# Patient Record
Sex: Female | Born: 1990 | Race: Black or African American | Hispanic: No | Marital: Single | Smoking: Never smoker
Health system: Southern US, Community
[De-identification: ages and names within clinical notes are randomized; demographics above are authoritative.]

## PROBLEM LIST (undated history)

## (undated) DIAGNOSIS — T7840XA Allergy, unspecified, initial encounter: Secondary | ICD-10-CM

## (undated) DIAGNOSIS — L509 Urticaria, unspecified: Secondary | ICD-10-CM

## (undated) DIAGNOSIS — T4145XA Adverse effect of unspecified anesthetic, initial encounter: Secondary | ICD-10-CM

## (undated) DIAGNOSIS — M1711 Unilateral primary osteoarthritis, right knee: Secondary | ICD-10-CM

## (undated) DIAGNOSIS — M2241 Chondromalacia patellae, right knee: Secondary | ICD-10-CM

## (undated) DIAGNOSIS — R7989 Other specified abnormal findings of blood chemistry: Secondary | ICD-10-CM

## (undated) DIAGNOSIS — R011 Cardiac murmur, unspecified: Secondary | ICD-10-CM

## (undated) DIAGNOSIS — K589 Irritable bowel syndrome without diarrhea: Secondary | ICD-10-CM

## (undated) DIAGNOSIS — D649 Anemia, unspecified: Secondary | ICD-10-CM

## (undated) DIAGNOSIS — K581 Irritable bowel syndrome with constipation: Secondary | ICD-10-CM

## (undated) DIAGNOSIS — T8859XA Other complications of anesthesia, initial encounter: Secondary | ICD-10-CM

## (undated) DIAGNOSIS — L83 Acanthosis nigricans: Secondary | ICD-10-CM

## (undated) DIAGNOSIS — M6751 Plica syndrome, right knee: Secondary | ICD-10-CM

## (undated) HISTORY — DX: Acanthosis nigricans: L83

## (undated) HISTORY — DX: Irritable bowel syndrome with constipation: K58.1

## (undated) HISTORY — DX: Allergy, unspecified, initial encounter: T78.40XA

## (undated) HISTORY — DX: Urticaria, unspecified: L50.9

## (undated) HISTORY — DX: Other specified abnormal findings of blood chemistry: R79.89

## (undated) HISTORY — DX: Cardiac murmur, unspecified: R01.1

## (undated) HISTORY — DX: Irritable bowel syndrome without diarrhea: K58.9

---
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
## 2013-09-30 ENCOUNTER — Emergency Department (HOSPITAL_BASED_OUTPATIENT_CLINIC_OR_DEPARTMENT_OTHER)
Admission: EM | Admit: 2013-09-30 | Discharge: 2013-09-30 | Disposition: A | Discharge status: Home / Self Care | Payer: Federal, State, Local not specified - PPO | Attending: Emergency Medicine | Admitting: Emergency Medicine

## 2013-09-30 ENCOUNTER — Encounter (HOSPITAL_BASED_OUTPATIENT_CLINIC_OR_DEPARTMENT_OTHER): Payer: Self-pay | Admitting: Emergency Medicine

## 2013-09-30 ENCOUNTER — Emergency Department (HOSPITAL_BASED_OUTPATIENT_CLINIC_OR_DEPARTMENT_OTHER): Payer: Federal, State, Local not specified - PPO

## 2013-09-30 DIAGNOSIS — H9209 Otalgia, unspecified ear: Secondary | ICD-10-CM | POA: Insufficient documentation

## 2013-09-30 DIAGNOSIS — Z79899 Other long term (current) drug therapy: Secondary | ICD-10-CM | POA: Insufficient documentation

## 2013-09-30 DIAGNOSIS — J208 Acute bronchitis due to other specified organisms: Secondary | ICD-10-CM

## 2013-09-30 DIAGNOSIS — J209 Acute bronchitis, unspecified: Secondary | ICD-10-CM | POA: Insufficient documentation

## 2013-09-30 MED ORDER — ALBUTEROL SULFATE HFA 108 (90 BASE) MCG/ACT IN AERS
2.0000 | INHALATION_SPRAY | RESPIRATORY_TRACT | Status: DC | PRN
  Administered 2013-09-30: 2 via RESPIRATORY_TRACT
  Filled 2013-09-30: qty 6.7

## 2013-09-30 NOTE — ED Provider Notes (Signed)
CSN: [id number]     Arrival date & time 09/30/13  2147 History   First MD Initiated Contact with Patient 09/30/13 2233     This chart was scribed for Ethelda Chick, MD by Arlan Organ, ED Scribe. This patient was seen in room MH08/[facility]08 and the patient's care was started 10:44 PM.   Chief Complaint  Patient presents with  . URI   Patient is a 22 y.o. female presenting with URI. The history is provided by the patient. No language interpreter was used.  URI Presenting symptoms: congestion, cough, ear pain and sore throat   Presenting symptoms: no fever   Congestion:    Location:  Chest Cough:    Severity:  Mild   Onset quality:  Gradual   Duration:  3 days   Timing:  Intermittent   Progression:  Unchanged   Chronicity:  New Ear pain:    Severity:  Mild   Onset quality:  Gradual   Duration:  3 days   Progression:  Unchanged   Chronicity:  New Sore throat:    Severity:  Mild   Onset quality:  Gradual   Duration:  4 days   Timing:  Constant   Progression:  Unchanged Severity:  Mild Onset quality:  Gradual Duration:  4 days Timing:  Intermittent   HPI Comments: Courtney [name] is a 22 y.o. female who presents to the Emergency Department complaining of a gradual onset, unchanged sore throat that initially started 4 day ago. She lists otalgia, SOB, and nasal congestion as associated symptoms. Pt states she noted the onset of her symptoms while she was at work on Friday, and states her symptoms started with her sore throat. She denies fever, lightheadedness, or generalized body aches. She denies any recent travel. No sick contacts.  History reviewed. No pertinent past medical history. History reviewed. No pertinent past surgical history. No family history on file. History  Substance Use Topics  . Smoking status: Never Smoker   . Smokeless tobacco: Never Used  . Alcohol Use: No   OB History   Grav Para Term Preterm Abortions TAB SAB Ect Mult Living                 Review of  Systems  Constitutional: Negative for fever.  HENT: Positive for congestion, ear pain and sore throat.   Respiratory: Positive for cough and shortness of breath.   Neurological: Negative for light-headedness.    Allergies  Review of patient's allergies indicates no known allergies.  Home Medications   Current Outpatient Rx  Name  Route  Sig  Dispense  Refill  . ibuprofen (ADVIL,MOTRIN) 200 MG tablet   Oral   Take 600 mg by mouth once.           . Norgestimate-Ethinyl Estradiol Triphasic (ORTHO TRI-CYCLEN LO) 0.18/0.215/0.25 MG-25 MCG tablet   Oral   Take 1 tablet by mouth daily.            Triage Vitals: BP 125/89  Pulse 80  Temp(Src) 98 F (36.7 C) (Oral)  Resp 16  SpO2 100%  Physical Exam  Nursing note and vitals reviewed. Constitutional: She is oriented to person, place, and time. She appears well-developed and well-nourished.  HENT:  Head: Normocephalic.  Mouth/Throat: Oropharynx is clear and moist.  Eyes: EOM are normal.  Neck: Normal range of motion.  Pulmonary/Chest: Effort normal. She has no wheezes.  Lungs clear No wheezing or crackles  Abdominal: She exhibits no distension.  Musculoskeletal: Normal  range of motion.  No swelling of lower extremities  Lymphadenopathy:    She has no cervical adenopathy.  Neurological: She is alert and oriented to person, place, and time.  Psychiatric: She has a normal mood and affect.  Note- no sig LAD  ED Course  Procedures (including critical care time)  DIAGNOSTIC STUDIES: Oxygen Saturation is 100% on RA, Normal by my interpretation.    COORDINATION OF CARE: 10:45 PM- Will give inhaler. Discussed treatment plan with pt at bedside and pt agreed to plan.     Labs Review Labs Reviewed - No data to display Imaging Review Dg Chest 2 View  09/30/2013   CLINICAL DATA:  Sore throat for 4 days. Shortness of breath, otalgia, nasal congestion.  EXAM: CHEST  2 VIEW  COMPARISON:  None.  FINDINGS: Mild hyperinflation.  The heart size and mediastinal contours are within normal limits. Both lungs are clear. The visualized skeletal structures are unremarkable.  IMPRESSION: No active cardiopulmonary disease.   Electronically Signed   By: Burman Nieves M.D.   On: 09/30/2013 23:38    EKG Interpretation   None       MDM   1. Viral bronchitis    Pt presenting with c/o cold symptoms with sore throat, nasal congestion, shortness of breath.  CXR reassuring.  Given inhaler for likely bronchitis.  She is overall nontoxic and well hydrated.  Discharged with strict return precautions.  Pt agreeable with plan.  I personally performed the services described in this documentation, which was scribed in my presence. The recorded information has been reviewed and is accurate.   Ethelda Chick, MD 10/02/13 1106

## 2013-09-30 NOTE — ED Notes (Signed)
C/o cold symptoms x 4 days with sore throat, congestion, sob, and congestion.

## 2013-09-30 NOTE — ED Notes (Signed)
Pt c/o cough congestion, minor  sore throat, and sob x 4 days [facility] and was referred her  Denies fever

## 2013-09-30 NOTE — ED Notes (Signed)
Patient transported to X-ray 

## 2014-01-12 ENCOUNTER — Encounter (HOSPITAL_BASED_OUTPATIENT_CLINIC_OR_DEPARTMENT_OTHER): Payer: Self-pay | Admitting: Emergency Medicine

## 2014-01-12 DIAGNOSIS — Z79899 Other long term (current) drug therapy: Secondary | ICD-10-CM | POA: Diagnosis not present

## 2014-01-12 DIAGNOSIS — J029 Acute pharyngitis, unspecified: Secondary | ICD-10-CM | POA: Insufficient documentation

## 2014-01-12 DIAGNOSIS — J3489 Other specified disorders of nose and nasal sinuses: Secondary | ICD-10-CM | POA: Insufficient documentation

## 2014-01-12 DIAGNOSIS — R05 Cough: Secondary | ICD-10-CM | POA: Diagnosis not present

## 2014-01-12 DIAGNOSIS — R059 Cough, unspecified: Secondary | ICD-10-CM | POA: Insufficient documentation

## 2014-01-12 LAB — RAPID STREP SCREEN (MED CTR MEBANE ONLY): Streptococcus, Group A Screen (Direct): NEGATIVE

## 2014-01-12 NOTE — ED Notes (Signed)
Sore throat x 4 days 

## 2014-01-13 ENCOUNTER — Emergency Department (HOSPITAL_BASED_OUTPATIENT_CLINIC_OR_DEPARTMENT_OTHER)
Admission: EM | Admit: 2014-01-13 | Discharge: 2014-01-13 | Disposition: A | Discharge status: Home / Self Care | Payer: Federal, State, Local not specified - PPO | Attending: Emergency Medicine | Admitting: Emergency Medicine

## 2014-01-13 DIAGNOSIS — J029 Acute pharyngitis, unspecified: Secondary | ICD-10-CM

## 2014-01-13 MED ORDER — PENICILLIN G BENZATHINE 1200000 UNIT/2ML IM SUSP
1.2000 10*6.[IU] | Freq: Once | INTRAMUSCULAR | Status: AC
  Administered 2014-01-13: 1.2 10*6.[IU] via INTRAMUSCULAR
  Filled 2014-01-13: qty 2

## 2014-01-13 NOTE — ED Provider Notes (Signed)
CSN: [id number]     Arrival date & time 01/12/14  2145 History   First MD Initiated Contact with Patient 01/13/14 0023     Chief Complaint  Patient presents with  . Sore Throat     (Consider location/radiation/quality/duration/timing/severity/associated sxs/prior Treatment) HPI Comments: Patient presents emergency department with chief complaint of sore throat. She states the sore throat started approximately 4 days ago. She denies any fevers chills. She endorses associated cough, and runny nose. She has not tried taking anything to alleviate her symptoms. There are no aggravating or alleviating factors.  The history is provided by the patient. No language interpreter was used.    History reviewed. No pertinent past medical history. History reviewed. No pertinent past surgical history. No family history on file. History  Substance Use Topics  . Smoking status: Never Smoker   . Smokeless tobacco: Never Used  . Alcohol Use: No   OB History   Grav Para Term Preterm Abortions TAB SAB Ect Mult Living                 Review of Systems  Constitutional: Negative for fever and chills.  HENT: Positive for postnasal drip, rhinorrhea, sinus pressure, sneezing and sore throat.   Respiratory: Positive for cough. Negative for shortness of breath.   Cardiovascular: Negative for chest pain.  Gastrointestinal: Negative for nausea, vomiting, abdominal pain, diarrhea and constipation.  Genitourinary: Negative for dysuria.      Allergies  Review of patient's allergies indicates no known allergies.  Home Medications   Current Outpatient Rx  Name  Route  Sig  Dispense  Refill  . ibuprofen (ADVIL,MOTRIN) 200 MG tablet   Oral   Take 600 mg by mouth once.           . Norgestimate-Ethinyl Estradiol Triphasic (ORTHO TRI-CYCLEN LO) 0.18/0.215/0.25 MG-25 MCG tablet   Oral   Take 1 tablet by mouth daily.            BP 126/70  Pulse 82  Temp(Src) 98.3 F (36.8 C) (Oral)  Resp 18  Ht  5\' 5"  (1.651 m)  Wt 150 lb (68.04 kg)  BMI 24.96 kg/m2  SpO2 100%  LMP 01/07/2014 Physical Exam  Nursing note and vitals reviewed. Constitutional: She is oriented to person, place, and time. She appears well-developed and well-nourished.  HENT:  Head: Normocephalic and atraumatic.  Oropharynx is moderately erythematous, no evidence of exudates or abscess, uvula is midline, airway is intact  Eyes: Conjunctivae and EOM are normal. Pupils are equal, round, and reactive to light.  Neck: Normal range of motion. Neck supple.  Cardiovascular: Normal rate and regular rhythm.  Exam reveals no gallop and no friction rub.   No murmur heard. Pulmonary/Chest: Effort normal and breath sounds normal. No respiratory distress. She has no wheezes. She has no rales. She exhibits no tenderness.  Abdominal: Soft. She exhibits no distension and no mass. There is no tenderness. There is no rebound and no guarding.  Musculoskeletal: Normal range of motion. She exhibits no edema and no tenderness.  Neurological: She is alert and oriented to person, place, and time.  Skin: Skin is warm and dry.  Psychiatric: She has a normal mood and affect. Her behavior is normal. Judgment and thought content normal.    ED Course  Procedures (including critical care time) Labs Review Labs Reviewed  RAPID STREP SCREEN  CULTURE, GROUP A STREP   Imaging Review No results found.   EKG Interpretation None  MDM   Final diagnoses:  Pharyngitis   Patients symptoms are consistent with URI, likely viral etiology. Discussed that antibiotics are not indicated for viral infections, but will give the patient a dose of penicillin per request. Pt will be discharged with symptomatic treatment.  Verbalizes understanding and is agreeable with plan. Pt is hemodynamically stable & in NAD prior to dc.     Roxy Horsemanobert Sheniqua Carolan, PA-C 01/13/14 [phone]

## 2014-01-13 NOTE — Discharge Instructions (Signed)
Pharyngitis °Pharyngitis is redness, pain, and swelling (inflammation) of your pharynx.  °CAUSES  °Pharyngitis is usually caused by infection. Most of the time, these infections are from viruses (viral) and are part of a cold. However, sometimes pharyngitis is caused by bacteria (bacterial). Pharyngitis can also be caused by allergies. Viral pharyngitis may be spread from person to person by coughing, sneezing, and personal items or utensils (cups, forks, spoons, toothbrushes). Bacterial pharyngitis may be spread from person to person by more intimate contact, such as kissing.  °SIGNS AND SYMPTOMS  °Symptoms of pharyngitis include:   °· Sore throat.   °· Tiredness (fatigue).   °· Low-grade fever.   °· Headache. °· Joint pain and muscle aches. °· Skin rashes. °· Swollen lymph nodes. °· Plaque-like film on throat or tonsils (often seen with bacterial pharyngitis). °DIAGNOSIS  °Your health care provider will ask you questions about your illness and your symptoms. Your medical history, along with a physical exam, is often all that is needed to diagnose pharyngitis. Sometimes, a rapid strep test is done. Other lab tests may also be done, depending on the suspected cause.  °TREATMENT  °Viral pharyngitis will usually get better in 3 4 days without the use of medicine. Bacterial pharyngitis is treated with medicines that kill germs (antibiotics).  °HOME CARE INSTRUCTIONS  °· Drink enough water and fluids to keep your urine clear or pale yellow.   °· Only take over-the-counter or prescription medicines as directed by your health care provider:   °· If you are prescribed antibiotics, make sure you finish them even if you start to feel better.   °· Do not take aspirin.   °· Get lots of rest.   °· Gargle with 8 oz of salt water (½ tsp of salt per 1 qt of water) as often as every 1 2 hours to soothe your throat.   °· Throat lozenges (if you are not at risk for choking) or sprays may be used to soothe your throat. °SEEK MEDICAL  CARE IF:  °· You have large, tender lumps in your neck. °· You have a rash. °· You cough up green, yellow-brown, or bloody spit. °SEEK IMMEDIATE MEDICAL CARE IF:  °· Your neck becomes stiff. °· You drool or are unable to swallow liquids. °· You vomit or are unable to keep medicines or liquids down. °· You have severe pain that does not go away with the use of recommended medicines. °· You have trouble breathing (not caused by a stuffy nose). °MAKE SURE YOU:  °· Understand these instructions. °· Will watch your condition. °· Will get help right away if you are not doing well or get worse. °Document Released: 09/18/2005 Document Revised: 07/09/2013 Document Reviewed: 05/26/2013 °[employer]® Patient Information ©2014 [employer]. ° °

## 2014-01-13 NOTE — ED Provider Notes (Signed)
Medical screening examination/treatment/procedure(s) were performed by non-physician practitioner and as supervising physician I was immediately available for consultation/collaboration.   EKG Interpretation None        Hanley SeamenJohn L Zaidin Blyden, MD 01/13/14 [phone]

## 2014-01-13 NOTE — ED Notes (Signed)
No rxn to ABT noted

## 2014-01-14 LAB — CULTURE, GROUP A STREP

## 2014-04-22 ENCOUNTER — Encounter (HOSPITAL_COMMUNITY): Payer: Self-pay | Admitting: *Deleted

## 2014-04-27 ENCOUNTER — Encounter (HOSPITAL_COMMUNITY): Payer: Self-pay | Admitting: Pharmacist

## 2014-04-30 ENCOUNTER — Ambulatory Visit (HOSPITAL_COMMUNITY): Payer: Federal, State, Local not specified - PPO | Admitting: Anesthesiology

## 2014-04-30 ENCOUNTER — Encounter (HOSPITAL_COMMUNITY)
Admission: RE | Disposition: A | Discharge status: Home / Self Care | Payer: Self-pay | Source: Ambulatory Visit | Attending: Obstetrics and Gynecology

## 2014-04-30 ENCOUNTER — Encounter (HOSPITAL_COMMUNITY): Payer: Self-pay | Admitting: Anesthesiology

## 2014-04-30 ENCOUNTER — Ambulatory Visit (HOSPITAL_COMMUNITY)
Admission: RE | Admit: 2014-04-30 | Discharge: 2014-04-30 | Disposition: A | Discharge status: Home / Self Care | Payer: Federal, State, Local not specified - PPO | Source: Ambulatory Visit | Attending: Obstetrics and Gynecology | Admitting: Obstetrics and Gynecology

## 2014-04-30 ENCOUNTER — Encounter (HOSPITAL_COMMUNITY): Payer: Federal, State, Local not specified - PPO | Admitting: Anesthesiology

## 2014-04-30 DIAGNOSIS — N838 Other noninflammatory disorders of ovary, fallopian tube and broad ligament: Secondary | ICD-10-CM | POA: Insufficient documentation

## 2014-04-30 DIAGNOSIS — N946 Dysmenorrhea, unspecified: Secondary | ICD-10-CM | POA: Diagnosis present

## 2014-04-30 DIAGNOSIS — N854 Malposition of uterus: Secondary | ICD-10-CM | POA: Diagnosis not present

## 2014-04-30 DIAGNOSIS — Z9889 Other specified postprocedural states: Secondary | ICD-10-CM

## 2014-04-30 HISTORY — PX: LAPAROSCOPY: SHX197

## 2014-04-30 LAB — TYPE AND SCREEN
ABO/RH(D): A POS
ANTIBODY SCREEN: NEGATIVE

## 2014-04-30 LAB — CBC
HCT: 33.1 % — ABNORMAL LOW (ref 36.0–46.0)
HEMOGLOBIN: 11.5 g/dL — AB (ref 12.0–15.0)
MCH: 30.5 pg (ref 26.0–34.0)
MCHC: 34.7 g/dL (ref 30.0–36.0)
MCV: 87.8 fL (ref 78.0–100.0)
PLATELETS: 223 10*3/uL (ref 150–400)
RBC: 3.77 MIL/uL — AB (ref 3.87–5.11)
RDW: 12.6 % (ref 11.5–15.5)
WBC: 7.8 10*3/uL (ref 4.0–10.5)

## 2014-04-30 LAB — PREGNANCY, URINE: PREG TEST UR: NEGATIVE

## 2014-04-30 LAB — ABO/RH: ABO/RH(D): A POS

## 2014-04-30 SURGERY — LAPAROSCOPY, DIAGNOSTIC
Anesthesia: General

## 2014-04-30 MED ORDER — GLYCOPYRROLATE 0.2 MG/ML IJ SOLN
INTRAMUSCULAR | Status: DC | PRN
  Administered 2014-04-30: 0.6 mg via INTRAVENOUS

## 2014-04-30 MED ORDER — PROPOFOL 10 MG/ML IV BOLUS
INTRAVENOUS | Status: DC | PRN
  Administered 2014-04-30: 30 mg via INTRAVENOUS
  Administered 2014-04-30: 150 mg via INTRAVENOUS

## 2014-04-30 MED ORDER — FENTANYL CITRATE 0.05 MG/ML IJ SOLN: 25.0000 ug | INTRAMUSCULAR | Status: DC | PRN

## 2014-04-30 MED ORDER — LIDOCAINE HCL (CARDIAC) 20 MG/ML IV SOLN
INTRAVENOUS | Status: DC | PRN
  Administered 2014-04-30: 30 mg via INTRAVENOUS

## 2014-04-30 MED ORDER — FENTANYL CITRATE 0.05 MG/ML IJ SOLN
INTRAMUSCULAR | Status: DC | PRN
  Administered 2014-04-30: 100 ug via INTRAVENOUS
  Administered 2014-04-30 (×3): 50 ug via INTRAVENOUS

## 2014-04-30 MED ORDER — ROCURONIUM BROMIDE 100 MG/10ML IV SOLN
INTRAVENOUS | Status: DC | PRN
  Administered 2014-04-30: 10 mg via INTRAVENOUS
  Administered 2014-04-30: 30 mg via INTRAVENOUS

## 2014-04-30 MED ORDER — SCOPOLAMINE 1 MG/3DAYS TD PT72
MEDICATED_PATCH | TRANSDERMAL | Status: AC
  Filled 2014-04-30: qty 1

## 2014-04-30 MED ORDER — MIDAZOLAM HCL 2 MG/2ML IJ SOLN: 0.5000 mg | Freq: Once | INTRAMUSCULAR | Status: DC | PRN

## 2014-04-30 MED ORDER — PROMETHAZINE HCL 25 MG/ML IJ SOLN: 6.2500 mg | INTRAMUSCULAR | Status: DC | PRN

## 2014-04-30 MED ORDER — BUPIVACAINE HCL (PF) 0.25 % IJ SOLN
INTRAMUSCULAR | Status: AC
  Filled 2014-04-30: qty 30

## 2014-04-30 MED ORDER — ONDANSETRON HCL 4 MG/2ML IJ SOLN
INTRAMUSCULAR | Status: DC | PRN
  Administered 2014-04-30: 4 mg via INTRAVENOUS

## 2014-04-30 MED ORDER — OXYCODONE-ACETAMINOPHEN 5-325 MG PO TABS: 1.0000 | ORAL_TABLET | ORAL | Status: DC | PRN

## 2014-04-30 MED ORDER — CEFAZOLIN SODIUM-DEXTROSE 2-3 GM-% IV SOLR
INTRAVENOUS | Status: AC
  Filled 2014-04-30: qty 50

## 2014-04-30 MED ORDER — SCOPOLAMINE 1 MG/3DAYS TD PT72
1.0000 | MEDICATED_PATCH | Freq: Once | TRANSDERMAL | Status: DC
  Administered 2014-04-30: 1.5 mg via TRANSDERMAL

## 2014-04-30 MED ORDER — NEOSTIGMINE METHYLSULFATE 10 MG/10ML IV SOLN
INTRAVENOUS | Status: DC | PRN
  Administered 2014-04-30: 3 mg via INTRAVENOUS

## 2014-04-30 MED ORDER — LACTATED RINGERS IV SOLN
INTRAVENOUS | Status: DC
  Administered 2014-04-30 (×2): via INTRAVENOUS

## 2014-04-30 MED ORDER — LIDOCAINE HCL (CARDIAC) 20 MG/ML IV SOLN
INTRAVENOUS | Status: AC
  Filled 2014-04-30: qty 5

## 2014-04-30 MED ORDER — DEXAMETHASONE SODIUM PHOSPHATE 10 MG/ML IJ SOLN
INTRAMUSCULAR | Status: DC | PRN
  Administered 2014-04-30: 8 mg via INTRAVENOUS

## 2014-04-30 MED ORDER — KETOROLAC TROMETHAMINE 30 MG/ML IJ SOLN: 15.0000 mg | Freq: Once | INTRAMUSCULAR | Status: DC | PRN

## 2014-04-30 MED ORDER — DEXAMETHASONE SODIUM PHOSPHATE 10 MG/ML IJ SOLN
INTRAMUSCULAR | Status: AC
  Filled 2014-04-30: qty 1

## 2014-04-30 MED ORDER — MIDAZOLAM HCL 2 MG/2ML IJ SOLN
INTRAMUSCULAR | Status: DC | PRN
  Administered 2014-04-30: 2 mg via INTRAVENOUS

## 2014-04-30 MED ORDER — MEPERIDINE HCL 25 MG/ML IJ SOLN: 6.2500 mg | INTRAMUSCULAR | Status: DC | PRN

## 2014-04-30 MED ORDER — KETOROLAC TROMETHAMINE 30 MG/ML IJ SOLN
INTRAMUSCULAR | Status: DC | PRN
  Administered 2014-04-30: 30 mg via INTRAVENOUS

## 2014-04-30 MED ORDER — NEOSTIGMINE METHYLSULFATE 10 MG/10ML IV SOLN
INTRAVENOUS | Status: AC
  Filled 2014-04-30: qty 1

## 2014-04-30 MED ORDER — MIDAZOLAM HCL 2 MG/2ML IJ SOLN
INTRAMUSCULAR | Status: AC
  Filled 2014-04-30: qty 2

## 2014-04-30 MED ORDER — GLYCOPYRROLATE 0.2 MG/ML IJ SOLN
INTRAMUSCULAR | Status: AC
Start: 2014-04-30 — End: 2014-04-30
  Filled 2014-04-30: qty 1

## 2014-04-30 MED ORDER — GLYCOPYRROLATE 0.2 MG/ML IJ SOLN
INTRAMUSCULAR | Status: AC
  Filled 2014-04-30: qty 1

## 2014-04-30 MED ORDER — IBUPROFEN 600 MG PO TABS: 600.0000 mg | ORAL_TABLET | Freq: Four times a day (QID) | ORAL | Status: DC | PRN

## 2014-04-30 MED ORDER — ONDANSETRON HCL 4 MG/2ML IJ SOLN
INTRAMUSCULAR | Status: AC
  Filled 2014-04-30: qty 2

## 2014-04-30 MED ORDER — PROPOFOL 10 MG/ML IV EMUL
INTRAVENOUS | Status: AC
  Filled 2014-04-30: qty 20

## 2014-04-30 MED ORDER — KETOROLAC TROMETHAMINE 30 MG/ML IJ SOLN
INTRAMUSCULAR | Status: AC
  Filled 2014-04-30: qty 1

## 2014-04-30 MED ORDER — 0.9 % SODIUM CHLORIDE (POUR BTL) OPTIME
TOPICAL | Status: DC | PRN
  Administered 2014-04-30: 1000 mL

## 2014-04-30 MED ORDER — FENTANYL CITRATE 0.05 MG/ML IJ SOLN
INTRAMUSCULAR | Status: AC
  Filled 2014-04-30: qty 5

## 2014-04-30 MED ORDER — ROCURONIUM BROMIDE 100 MG/10ML IV SOLN
INTRAVENOUS | Status: AC
  Filled 2014-04-30: qty 1

## 2014-04-30 MED ORDER — CEFAZOLIN SODIUM-DEXTROSE 2-3 GM-% IV SOLR
2.0000 g | INTRAVENOUS | Status: AC
  Administered 2014-04-30: 2 g via INTRAVENOUS

## 2014-04-30 MED ORDER — BUPIVACAINE HCL (PF) 0.25 % IJ SOLN
INTRAMUSCULAR | Status: DC | PRN
  Administered 2014-04-30: 17 mL

## 2014-04-30 SURGICAL SUPPLY — 33 items
ADH SKN CLS APL DERMABOND .7 (GAUZE/BANDAGES/DRESSINGS) ×1
APL SKNCLS STERI-STRIP NONHPOA (GAUZE/BANDAGES/DRESSINGS)
BAG SPEC RTRVL LRG 6X4 10 (ENDOMECHANICALS)
BENZOIN TINCTURE PRP APPL 2/3 (GAUZE/BANDAGES/DRESSINGS) IMPLANT
BLADE SURG 11 STRL SS (BLADE) ×2 IMPLANT
CHLORAPREP W/TINT 26ML (MISCELLANEOUS) ×2 IMPLANT
CLOTH BEACON ORANGE TIMEOUT ST (SAFETY) ×2 IMPLANT
DERMABOND ADVANCED (GAUZE/BANDAGES/DRESSINGS) ×1
DERMABOND ADVANCED .7 DNX12 (GAUZE/BANDAGES/DRESSINGS) ×1 IMPLANT
DRSG COVADERM PLUS 2X2 (GAUZE/BANDAGES/DRESSINGS) ×3 IMPLANT
DRSG OPSITE POSTOP 3X4 (GAUZE/BANDAGES/DRESSINGS) ×1 IMPLANT
FORCEPS CUTTING 33CM 5MM (CUTTING FORCEPS) IMPLANT
GLOVE BIO SURGEON STRL SZ7 (GLOVE) ×2 IMPLANT
GLOVE BIOGEL PI IND STRL 7.0 (GLOVE) ×2 IMPLANT
GLOVE BIOGEL PI INDICATOR 7.0 (GLOVE) ×2
GOWN STRL REUS W/TWL LRG LVL3 (GOWN DISPOSABLE) ×4 IMPLANT
PACK LAPAROSCOPY BASIN (CUSTOM PROCEDURE TRAY) ×2 IMPLANT
POUCH SPECIMEN RETRIEVAL 10MM (ENDOMECHANICALS) IMPLANT
PROTECTOR NERVE ULNAR (MISCELLANEOUS) ×2 IMPLANT
SET IRRIG TUBING LAPAROSCOPIC (IRRIGATION / IRRIGATOR) IMPLANT
STRIP CLOSURE SKIN 1/4X4 (GAUZE/BANDAGES/DRESSINGS) IMPLANT
SUT MNCRL AB 4-0 PS2 18 (SUTURE) ×2 IMPLANT
SUT PLAIN 2 0 (SUTURE)
SUT PLAIN ABS 2-0 CT1 27XMFL (SUTURE) IMPLANT
SUT VICRYL 0 UR6 27IN ABS (SUTURE) ×2 IMPLANT
SYR 30ML LL (SYRINGE) ×1 IMPLANT
TOWEL OR 17X24 6PK STRL BLUE (TOWEL DISPOSABLE) ×4 IMPLANT
TRAY FOLEY CATH 14FR (SET/KITS/TRAYS/PACK) ×2 IMPLANT
TROCAR XCEL NON-BLD 11X100MML (ENDOMECHANICALS) IMPLANT
TROCAR XCEL NON-BLD 5MMX100MML (ENDOMECHANICALS) ×2 IMPLANT
TROCAR XCEL OPT SLVE 5M 100M (ENDOMECHANICALS) IMPLANT
WARMER LAPAROSCOPE (MISCELLANEOUS) ×2 IMPLANT
WATER STERILE IRR 1000ML POUR (IV SOLUTION) ×2 IMPLANT

## 2014-04-30 NOTE — Anesthesia Preprocedure Evaluation (Signed)
Anesthesia Evaluation  Patient identified by MRN, date of birth, ID band Patient awake    Reviewed: Allergy & Precautions, H&P , Patient's Chart, lab work & pertinent test results, reviewed documented beta blocker date and time   History of Anesthesia Complications Negative for: history of anesthetic complications  Airway Mallampati: II TM Distance: >3 FB Neck ROM: full    Dental   Pulmonary  breath sounds clear to auscultation        Cardiovascular Exercise Tolerance: Good Rhythm:regular Rate:Normal     Neuro/Psych    GI/Hepatic   Endo/Other    Renal/GU      Musculoskeletal   Abdominal   Peds  Hematology   Anesthesia Other Findings   Reproductive/Obstetrics                           Anesthesia Physical Anesthesia Plan  ASA: I  Anesthesia Plan: General ETT   Post-op Pain Management:    Induction:   Airway Management Planned:   Additional Equipment:   Intra-op Plan:   Post-operative Plan:   Informed Consent: I have reviewed the patients History and Physical, chart, labs and discussed the procedure including the risks, benefits and alternatives for the proposed anesthesia with the patient or authorized representative who has indicated his/her understanding and acceptance.   Dental Advisory Given  Plan Discussed with: CRNA and Surgeon  Anesthesia Plan Comments:         Anesthesia Quick Evaluation  

## 2014-04-30 NOTE — H&P (Signed)
  History of Present Illness  General:  Severe dysmenorrhea since 7th grade. Has been on OCPs since that time. OCPs have recently not relieved symptoms. Pt has a strong family h/o endometriosis and desires definitive diagnosis. Mother is present. States that her daughter sometimes lays in the fetal position during menses, has nausea and vomiting. Uses muscle relaxants.   Current Medications  Taking   Tri-Sprintec 0.18/0.215/0.25 MG-35 MCG Tablet 1 tablet Once a day, Notes: Did not work   Discontinued   Voltaren 50 MG Tablet 1 tablet twice a day with food for menstrual cramping   Percocet 5-325 mg 5-325 mg Tablets one or two tablets every four to six hours prn pain   Medication List reviewed and reconciled with the patient    Past Medical History  Menstrual Management   Surgical History  None    Family History  Father: alive  Mother: alive  denies any GYN family cancer hx.   Social History  General:  Tobacco use  cigarettes: Never smoked Tobacco history last updated 04/29/2014 no Smoking.  no Tobacco Exposure.  no Recreational drug use.  Exercise: yes, regularly.  Marital Status: single.  Children: none.  Lives with mother, step father and 1/2 sister. Attends [employer].   Gyn History  Sexual activity never sexually active.  Periods : every month.  LMP 04/06/14.  Birth control ocp.  Last pap smear date 10/17/10, negative.  Denies H/O Last mammogram date.  Denies H/O Abnormal pap smear.  Denies H/O STD.    OB History  Never been pregnant per patient.    Allergies  N.K.D.A.   Hospitalization/Major Diagnostic Procedure  None    Review of Systems  Denies fever/chills, chest pain, SOB, headaches, numbness/tingling. No h/o complication with anesthesia, bleeding disorders or blood clots.   Vital Signs  Wt 154, Wt change -6 lb, Ht 65, BMI 25.62, Pulse sitting 91, BP sitting 111/73.   Physical Examination  GENERAL:  Patient appears alert and oriented.   General Appearance: well-appearing, well-developed, no acute distress.  Speech: clear.  LUNGS:  Auscultation: no wheezing/rhonchi/rales. CTA bilaterally.  HEART:  Heart sounds: normal. RRR. no murmur.  ABDOMEN:  General: soft nontender, nondistended, no masses.  FEMALE GENITOURINARY:  Pelvic Not examined.  EXTREMITIES:  General: No edema or calf tenderness.     Assessments   1. Pre-op exam - V72.84 (Primary)   2. Dysmenorrhea - 625.3   Treatment  1. Pre-op exam  Notes: R/B/A of procedure discussed with pt at length. All questions answered. Consent obtained. Discussed at lenghth with mother and pt goal of surgery, which is diagnostic. Will ablate endometriosis if present and safe. Limitations reviewed with pt and mother. Will abort procedure and refer to a specialist of Stage 3-4. Ig adhesions presents, risk of injury to bowel, bladder, tubes ,etc increased. Declines trial of Depo Lupron. Both verbalized understanding.    Follow Up  2 Weeks post op

## 2014-04-30 NOTE — Discharge Instructions (Addendum)
Diagnostic Laparoscopy °Laparoscopy is a surgical procedure. It is used to diagnose and treat diseases inside the belly (abdomen). It is usually a brief, common, and relatively simple procedure. The laparoscopeis a thin, lighted, pencil-sized instrument. It is like a telescope. It is inserted into your abdomen through a small cut (incision). Your caregiver can look at the organs inside your body through this instrument. He or she can see if there is anything abnormal. °Laparoscopy can be done either in a hospital or outpatient clinic. You may be given a mild sedative to help you relax before the procedure. Once in the operating room, you will be given a drug to make you sleep (general anesthesia). Laparoscopy usually lasts less than 1 hour. After the procedure, you will be monitored in a recovery area until you are stable and doing well. Once you are home, it will take 2 to 3 days to fully recover. °RISKS AND COMPLICATIONS  °Laparoscopy has relatively few risks. Your caregiver will discuss the risks with you before the procedure. °Some problems that can occur include: °· Infection. °· Bleeding. °· Damage to other organs. °· Anesthetic side effects. °PROCEDURE °Once you receive anesthesia, your surgeon inflates the abdomen with a harmless gas (carbon dioxide). This makes the organs easier to see. The laparoscope is inserted into the abdomen through a small incision. This allows your surgeon to see into the abdomen. Other small instruments are also inserted into the abdomen through other small openings. Many surgeons attach a video camera to the laparoscope to enlarge the view. °During a diagnostic laparoscopy, the surgeon may be looking for inflammation, infection, or cancer. Your surgeon may take tissue samples(biopsies). The samples are sent to a specialist in looking at cells and tissue samples (pathologist). The pathologist examines them under a microscope. Biopsies can help to diagnose or confirm a  disease. °AFTER THE PROCEDURE  °· The gas is released from inside the abdomen. °· The incisions are closed with stitches (sutures). Because these incisions are small (usually less than 1/2 inch), there is usually minimal discomfort after the procedure. There may be some mild discomfort in the throat. This is from the tube placed in the throat while you were sleeping. You may have some mild abdominal discomfort. There may also be discomfort from the instrument placement incisions in the abdomen. °· The recovery time is shortened as long as there are no complications. °· You will rest in a recovery room until stable and doing well. As long as there are no complications, you may be allowed to go home. °FINDING OUT THE RESULTS OF YOUR TEST °Not all test results are available during your visit. If your test results are not back during the visit, make an appointment with your caregiver to find out the results. Do not assume everything is normal if you have not heard from your caregiver or the medical facility. It is important for you to follow up on all of your test results. °HOME CARE INSTRUCTIONS  °· Take all medicines as directed. °· Only take over-the-counter or prescription medicines for pain, discomfort, or fever as directed by your caregiver. °· Resume daily activities as directed. °· Showers are preferred over baths. °· You may resume sexual activities in 1 week or as directed. °· Do not drive while taking narcotics. °SEEK MEDICAL CARE IF:  °· There is increasing abdominal pain. °· There is new pain in the shoulders (shoulder strap areas). °· You feel lightheaded or faint. °· You have the chills. °· You or   your child has an oral temperature above 102° F (38.9° C). °· There is pus-like (purulent) drainage from any of the wounds. °· You are unable to pass gas or have a bowel movement. °· You feel sick to your stomach (nauseous) or throw up (vomit). °MAKE SURE YOU:  °· Understand these instructions. °· Will watch  your condition. °· Will get help right away if you are not doing well or get worse. °Document Released: 12/25/2000 Document Revised: 01/13/2013 Document Reviewed: 09/18/2007 °Exit[employer]® Patient Information ©2015 [employer]. This information is not intended to replace advice given to you by your health care provider. Make sure you discuss any questions you have with your health care provider. ° °Post Anesthesia Home Care Instructions ° °Activity: °Get plenty of rest for the remainder of the day. A responsible adult should stay with you for 24 hours following the procedure.  °For the next 24 hours, DO NOT: °-Drive a car °-Operate machinery °-Drink alcoholic beverages °-Take any medication unless instructed by your physician °-Make any legal decisions or sign important papers. ° °Meals: °Start with liquid foods such as gelatin or soup. Progress to regular foods as tolerated. Avoid greasy, spicy, heavy foods. If nausea and/or vomiting occur, drink only clear liquids until the nausea and/or vomiting subsides. Call your physician if vomiting continues. ° °Special Instructions/Symptoms: °Your throat may feel dry or sore from the anesthesia or the breathing tube placed in your throat during surgery. If this causes discomfort, gargle with warm salt water. The discomfort should disappear within 24 hours. ° °

## 2014-04-30 NOTE — Transfer of Care (Signed)
Immediate Anesthesia Transfer of Care Note  Patient: Courtney [name]  Procedure(s) Performed: Procedure(s): LAPAROSCOPY DIAGNOSTIC (N/A)  Patient Location: PACU  Anesthesia Type:General  Level of Consciousness: awake  Airway & Oxygen Therapy: Patient connected to nasal cannula oxygen  Post-op Assessment: Report given to PACU RN and Post -op Vital signs reviewed and stable  Post vital signs: Reviewed and stable  Complications: No apparent anesthesia complications

## 2014-04-30 NOTE — Anesthesia Postprocedure Evaluation (Signed)
  Anesthesia Post Note  Patient: Courtney [name]  Procedure(s) Performed: Procedure(s) (LRB): LAPAROSCOPY DIAGNOSTIC (N/A)  Anesthesia type: GA  Patient location: PACU  Post pain: Pain level controlled  Post assessment: Post-op Vital signs reviewed  Last Vitals:  Filed Vitals:   04/30/14 1800  BP: 113/50  Pulse: 53  Temp:   Resp: 12    Post vital signs: Reviewed  Level of consciousness: sedated  Complications: No apparent anesthesia complications

## 2014-04-30 NOTE — Interval H&P Note (Signed)
History and Physical Interval Note:  04/30/2014 3:58 PM  [name]  has presented today for surgery, with the diagnosis of Dysmenorrhea  The various methods of treatment have been discussed with the patient and family. After consideration of risks, benefits and other options for treatment, the patient has consented to  Procedure(s): LAPAROSCOPY DIAGNOSTIC (N/A) as a surgical intervention .  The patient's history has been reviewed, patient examined, no change in status, stable for surgery.  I have reviewed the patient's chart and labs.  Questions were answered to the patient's satisfaction.     Dion BodyVARNADO, Billye Pickerel

## 2014-04-30 NOTE — Brief Op Note (Signed)
04/30/2014  5:39 PM  PATIENT:  Courtney [name]  23 y.o. female  PRE-OPERATIVE DIAGNOSIS:  Dysmenorrhea  POST-OPERATIVE DIAGNOSIS:  Same  PROCEDURE:  Procedure(s): LAPAROSCOPY DIAGNOSTIC (N/A) Removal of left fallopian tube mass  SURGEON:  Surgeon(s) and Role:    * Geryl RankinsEvelyn Tobi Groesbeck, MD - Primary    * Sharon SellerJennifer M Ozan, DO - Assisting  PHYSICIAN ASSISTANT: Dr. Charlotta Newtonzan  ASSISTANTS: Technician   ANESTHESIA:   general  EBL:  Total I/O In: 400 [I.V.:400] Out: 100 [Urine:100]  BLOOD ADMINISTERED:none  DRAINS: Urinary Catheter (Foley)   LOCAL MEDICATIONS USED:  MARCAINE     SPECIMEN:  Source of Specimen:  fallopian tube mass, pelvic fluid  DISPOSITION OF SPECIMEN:  PATHOLOGY  COUNTS:  YES  TOURNIQUET:  * No tourniquets in log *  DICTATION: .Other Dictation: Dictation Number [id number]  PLAN OF CARE: Discharge to home after PACU  PATIENT DISPOSITION:  PACU - hemodynamically stable.   Delay start of Pharmacological VTE agent (>24hrs) due to surgical blood loss or risk of bleeding: yes

## 2014-05-01 ENCOUNTER — Encounter (HOSPITAL_COMMUNITY): Payer: Self-pay | Admitting: Obstetrics and Gynecology

## 2014-05-01 NOTE — Op Note (Signed)
NAME:  Courtney [name], Courtney [name]                    ACCOUNT NO.:  [id number]  MEDICAL RECORD NO.:  [id number]  LOCATION:  [facility]                          [facility]ACILITY:  [facility]  PHYSICIAN:  Pieter PartridgeEvelyn B Breahna Boylen, MD   DATE OF BIRTH:  [birth year]  DATE OF PROCEDURE: DATE OF DISCHARGE:                              OPERATIVE REPORT   PREOPERATIVE DIAGNOSIS:  Dysmenorrhea.  POSTOPERATIVE DIAGNOSIS:  Dysmenorrhea.  PROCEDURES: 1. Diagnostic laparoscopy. 2. Removal of left fallopian tube mass.  SURGEON:  Pieter PartridgeEvelyn B Macee Venables, MD  ASSISTANT:  Dr. Myna HidalgoJennifer Ozan and technician.  ANESTHESIA:  General.  ESTIMATED BLOOD LOSS:  Minimal.  URINE OUTPUT:  100 mL.  BLOOD ADMINISTERED:  None.  DRAINS:  Foley catheter.  ANTIBIOTICS:  Local 0.25% Marcaine.  SPECIMEN:  Fallopian tube mass, pelvic fluid sent to Pathology.  DISPOSITION:  To PACU, hemodynamically stable.  FINDINGS:  Normal uterus and ovaries bilaterally.  The left fallopian tube has a small clear vesicular lesion that was biopsied/removed. Fallopian tubes were clipped bilaterally.  Upper abdomen appeared normal.  Liver edge was normal.  Appendix normal.  PROCEDURE IN DETAIL:  Ms. Wallene DalesMott was taken to the operating room.  She underwent general endotracheal anesthesia without complications.  She was then placed in the dorsal supine position and prepped and draped in a normal sterile fashion.  A time-out was taken and then a Pederson speculum was inserted to the vagina after an exam under anesthesia, confirmed an anteverted uterus.  The cervix was identified and grasped with a single-tooth tenaculum.  I attempted to put a Hulka uterine manipulator through, but I could not get that through the endocervix. So, an Acorn uterine manipulator was then placed in and a single-tooth tenaculum stayed in place.  The speculum was then removed from the vagina.  Attention was turned to the abdomen.  A 5-mm incision was made in the infraumbilical fold.  Under direct  visualization, I went in with camera and a 5-mm trocar.  Once intraabdominal access was confirmed, I put on the low CO2 gas for insufflation of the abdomen.  I felt like I was in an adhesion, which actually turned out that it was through the portion of the omentum, but no bleeding was noted.  Once the abdomen was inflated, a second port in the left lower quadrant was inserted under direct visualization.  Both ports were placed, were premedicated with 0.25% Marcaine.  Once the second port was in, I was able to use a blunt probe manipulator and the blunt probe manipulator and the uterus and the pelvic anatomy were examined, and pictures were taken.  A syringe was used to get out of the pelvic fluid that was sent to Pathology.  Biopsy forceps were used to grasp the vesicular lesion on the fallopian tubes, and they were sharp and so they were not pulled, there was a little bit of tissues hanging.  I got the endocautery shears and cut that and cauterized the area where the biopsy was taken.  That area was then irrigated with a syringe.  All instruments were then removed from the abdomen under direct visualization.  While the umbilical trocar was then placed, expiratory  breaths were given.  The incisions were then reapproximated with 4-0 Monocryl in a subcuticular fashion.  Liquiderm was then applied to the incisions.  The single-tooth tenaculum and Acorn were removed from the uterus.  The cervix appeared to be hemostatic and was not bleeding.  No lacerations noted.  The patient tolerated the procedure well.  She received Ancef 2 g IV prior to the procedure.  All instrument, sponge, and needle counts were correct x3.  The patient tolerated the procedure well and went to the recovery room in stable condition.     Pieter Partridge, MD     EBV/MEDQ  D:  04/30/2014  T:  04/30/2014  Job:  [id number]

## 2015-02-25 ENCOUNTER — Encounter: Payer: Self-pay | Admitting: Dietician

## 2015-02-25 ENCOUNTER — Encounter: Payer: Federal, State, Local not specified - PPO | Attending: Obstetrics and Gynecology | Admitting: Dietician

## 2015-02-25 VITALS — Ht 65.5 in | Wt 178.9 lb

## 2015-02-25 DIAGNOSIS — Z713 Dietary counseling and surveillance: Secondary | ICD-10-CM | POA: Diagnosis not present

## 2015-02-25 DIAGNOSIS — Z6829 Body mass index (BMI) 29.0-29.9, adult: Secondary | ICD-10-CM | POA: Insufficient documentation

## 2015-02-25 DIAGNOSIS — E669 Obesity, unspecified: Secondary | ICD-10-CM | POA: Insufficient documentation

## 2015-02-25 NOTE — Progress Notes (Signed)
Medical Nutrition Therapy:  Appt start time: 1500 end time:  1600.   Assessment:  Primary concerns today: obesity. Patient lives with her mom and sister.  Her parents recently divorced this past year.  She is currently out of school and will be going back to [employer] starting in the fall.  She works as a Water engineer.  She played basketball for 12 years and has "bad knees" that effect her exercise level.  She eats out with her mom and sister 4-5 times a week to [employer], [employer], [employer], [address], or [address].  She loves to cook at home.  Her mom grocery shops occasionally but Courtney [name] will also grocery shop for things.  Her little sister likes to eat out a lot so sometimes she will pick her up something and then cook her own meal at home.  She has recently cut out her [facility] and increased her cardio workouts.  Weight history: she weighed 65 lbs at 24 years old, after stopping basketball gained to 160 lbs, then following laproscopic sugery in July 2015 and throughout the many court showings for her parents divorce she gained up to her highest weight of 184 lbs.  Today she weighs 179 lbs.  She has goals to lose weight and just be a healthier eater overall.  She wants for her clothes to fit well again.      Preferred Learning Style:  No preference indicated   Learning Readiness:  Ready  Change in progress   MEDICATIONS: see list   DIETARY INTAKE:  Usual eating pattern includes 2 meals and 3 snacks per day.  Everyday foods include fast food, fruit, string cheese.  Avoided foods include creamy foods like yogurt.    24-hr recall:  B ( AM): skips  Snk ( AM): banana  L (1-2 PM): [employer] 12 count nugget and lemonade or makes grilled chicken with olive oil and steamable veggies Snk ( PM): sometimes a cutie tangerine or string cheese D (5 PM): chicken wings, or [address] chicken with broccolli & cheese and mashed potatoes Snk (9 PM): blueberries,  kettle cooked chips, or string cheese Beverages: lemonade, occasionally a mini can of Coke, not much water - less than 24 oz per day, sometimes 1 cup of chocolate milk  Usual physical activity: runs on the treadmill for 1-2 miles at the gym in the evening and may do some strength training, 3-4 nights a week  Progress Towards Goal(s):  In progress.   Nutritional Diagnosis:  Damar-3.3 Overweight/obesity As related to frequent consumption of foods outside of the home and decreased physical activity over the last 3 years.  As evidenced by patient report and BMI >30.    Intervention:  Nutrition education and counseling.  Discussed healthy, sustainable weight loss goals.  Praised her for making her health a priority and for the changes she has made so far to her activity level and drink choices.  Discussed food timing and foods to eat around physical activity times.  Stated importance of food choices for weight loss goals.  Utilized MyPlate to demonstrate healthy, balanced meals.  Patient was engaged and had good nutrition questions, expect positive results.  Together we came up with the following plan:  Goals: Choose more grilled items and veggies and salads when out to restaurants.   Add in a morning gym routine 3-4 days a week. Add a protein to your morning fruit like cashews or peanuts.  Limit to 1/4 cup serving. Stretch after workouts  and have a low-fat protein like Malawiturkey or beef jerky to heal your muscles. During vacation focus on increasing water intake. Use the Plate Method to create healthy, balanced meals at home and when out to eat.  Teaching Method Utilized:  Visual Auditory  Handouts given during visit include:  MyPlate portion plate  Barriers to learning/adherence to lifestyle change: none  Demonstrated degree of understanding via:  Teach Back   Monitoring/Evaluation:  Dietary intake, exercise, and body weight in 2 month(s).

## 2015-02-25 NOTE — Patient Instructions (Addendum)
Choose more grilled items and veggies and salads when out to restaurants.   Add in a morning gym routine 3-4 days a week. Add a protein to your morning fruit like cashews or peanuts.  Limit to 1/4 cup serving. Stretch after workouts and have a low-fat protein like Malawiturkey or beef jerky to heal your muscles. During vacation focus on increasing water intake. Use the Plate Method to create healthy, balanced meals at home and when out to eat.

## 2015-04-29 ENCOUNTER — Ambulatory Visit: Payer: Federal, State, Local not specified - PPO | Admitting: Dietician

## 2015-07-03 DIAGNOSIS — M6751 Plica syndrome, right knee: Secondary | ICD-10-CM

## 2015-07-03 DIAGNOSIS — M1711 Unilateral primary osteoarthritis, right knee: Secondary | ICD-10-CM

## 2015-07-03 DIAGNOSIS — M2241 Chondromalacia patellae, right knee: Secondary | ICD-10-CM

## 2015-07-03 HISTORY — DX: Unilateral primary osteoarthritis, right knee: M17.11

## 2015-07-03 HISTORY — DX: Plica syndrome, right knee: M67.51

## 2015-07-03 HISTORY — DX: Chondromalacia patellae, right knee: M22.41

## 2015-07-26 ENCOUNTER — Other Ambulatory Visit: Payer: Self-pay | Admitting: Physician Assistant

## 2015-07-26 ENCOUNTER — Encounter (HOSPITAL_BASED_OUTPATIENT_CLINIC_OR_DEPARTMENT_OTHER): Payer: Self-pay | Admitting: *Deleted

## 2015-07-26 NOTE — H&P (Signed)
Courtney [name] comes in to discuss MRI and definitive treatment recommendation for her right knee.  History well outlined in the note by Dr. Farris HasKramer from earlier this month.  This is longstanding patellofemoral syndrome, right greater than left.  She has had a marked impact on all activity because of patellofemoral issues.  She can tolerate symptoms on the left, but they are completely intolerable on the right.  Her workup has included four view x-ray that shows a little lateral patellofemoral positioning.  MRI scan that shows overload lateral patellofemoral joint with a little partial thickness breakdown apex and lateral facet.  All other structures intact.  Her trochlear groove looks reasonable.  All of this reviewed with her.   History and general exam is reviewed.        EXAMINATION: Specifically, this is a healthy fit 24 year-old.  She has very god VMO strength on both sides.  She has lateral tracking.  Increased Q angle on both sides, at least moderate.  Lateral tracking and tethering, right much greater than left.  Tender medial plica, right much greater than left.  Some crepitus on the right, not too extreme.  Very mild patella alta.  All other ligaments stable.    DISPOSITION:  Long talk with Marsha.  More than 25 minutes spent face-to-face.  Need for treatment straightforward.  I have gone through the entire description of her pathology and treatment options.  Given her amount of tethering and the fact that there has been no previous intervention I am planning on exam under anesthesia, arthroscopy, chondroplasty patella and excise her plica.  I will then add a lateral retinacular release.  My hope is that this is going to be enough coupled with post-op rehab to allow improvement and a tolerable situation.  The next step would be a Fulkerson procedure, but I think based on what I am seeing I need to try a scope and lateral release before we move immediately to a Fulkerson procedure.  Went through all of that with  her in detail.  She understands if lateral release alone is not enough she may have to do a Fulkerson.  Paperwork complete.  All questions answered.  I will see her at the time of operative intervention.    Loreta Aveaniel F. Murphy, M.D.

## 2015-07-27 ENCOUNTER — Other Ambulatory Visit: Payer: Self-pay | Admitting: Physician Assistant

## 2015-07-29 ENCOUNTER — Ambulatory Visit (HOSPITAL_BASED_OUTPATIENT_CLINIC_OR_DEPARTMENT_OTHER): Payer: Federal, State, Local not specified - PPO | Admitting: Certified Registered"

## 2015-07-29 ENCOUNTER — Encounter (HOSPITAL_BASED_OUTPATIENT_CLINIC_OR_DEPARTMENT_OTHER): Payer: Self-pay | Admitting: Certified Registered"

## 2015-07-29 ENCOUNTER — Encounter (HOSPITAL_BASED_OUTPATIENT_CLINIC_OR_DEPARTMENT_OTHER)
Admission: RE | Disposition: A | Discharge status: Home / Self Care | Payer: Self-pay | Source: Ambulatory Visit | Attending: Orthopedic Surgery

## 2015-07-29 ENCOUNTER — Ambulatory Visit (HOSPITAL_BASED_OUTPATIENT_CLINIC_OR_DEPARTMENT_OTHER)
Admission: RE | Admit: 2015-07-29 | Discharge: 2015-07-29 | Disposition: A | Discharge status: Home / Self Care | Payer: Federal, State, Local not specified - PPO | Source: Ambulatory Visit | Attending: Orthopedic Surgery | Admitting: Orthopedic Surgery

## 2015-07-29 DIAGNOSIS — M6751 Plica syndrome, right knee: Secondary | ICD-10-CM | POA: Insufficient documentation

## 2015-07-29 DIAGNOSIS — M65861 Other synovitis and tenosynovitis, right lower leg: Secondary | ICD-10-CM | POA: Diagnosis not present

## 2015-07-29 DIAGNOSIS — M25861 Other specified joint disorders, right knee: Secondary | ICD-10-CM | POA: Diagnosis not present

## 2015-07-29 DIAGNOSIS — M2241 Chondromalacia patellae, right knee: Secondary | ICD-10-CM | POA: Diagnosis present

## 2015-07-29 HISTORY — DX: Anemia, unspecified: D64.9

## 2015-07-29 HISTORY — DX: Chondromalacia patellae, right knee: M22.41

## 2015-07-29 HISTORY — DX: Unilateral primary osteoarthritis, right knee: M17.11

## 2015-07-29 HISTORY — DX: Other complications of anesthesia, initial encounter: T88.59XA

## 2015-07-29 HISTORY — DX: Adverse effect of unspecified anesthetic, initial encounter: T41.45XA

## 2015-07-29 HISTORY — PX: KNEE ARTHROSCOPY WITH LATERAL RELEASE: SHX5649

## 2015-07-29 HISTORY — PX: SYNOVECTOMY: SHX5180

## 2015-07-29 HISTORY — DX: Plica syndrome, right knee: M67.51

## 2015-07-29 SURGERY — ARTHROSCOPY, KNEE, WITH LATERAL RETINACULUM RELEASE
Anesthesia: General | Site: Knee | Laterality: Right

## 2015-07-29 MED ORDER — ONDANSETRON HCL 4 MG/2ML IJ SOLN
INTRAMUSCULAR | Status: DC | PRN
  Administered 2015-07-29: 4 mg via INTRAVENOUS

## 2015-07-29 MED ORDER — BUPIVACAINE HCL (PF) 0.25 % IJ SOLN
INTRAMUSCULAR | Status: DC | PRN
  Administered 2015-07-29: 20 mL

## 2015-07-29 MED ORDER — METHYLPREDNISOLONE ACETATE 40 MG/ML IJ SUSP
INTRAMUSCULAR | Status: AC
  Filled 2015-07-29: qty 1

## 2015-07-29 MED ORDER — GLYCOPYRROLATE 0.2 MG/ML IJ SOLN: 0.2000 mg | Freq: Once | INTRAMUSCULAR | Status: DC | PRN

## 2015-07-29 MED ORDER — DEXAMETHASONE SODIUM PHOSPHATE 4 MG/ML IJ SOLN
INTRAMUSCULAR | Status: DC | PRN
  Administered 2015-07-29: 10 mg via INTRAVENOUS

## 2015-07-29 MED ORDER — HYDROMORPHONE HCL 1 MG/ML IJ SOLN
0.2500 mg | INTRAMUSCULAR | Status: DC | PRN
Start: 2015-07-29 — End: 2015-07-29
  Administered 2015-07-29 (×2): 0.5 mg via INTRAVENOUS

## 2015-07-29 MED ORDER — ONDANSETRON HCL 4 MG PO TABS: 4.0000 mg | ORAL_TABLET | Freq: Three times a day (TID) | ORAL | Status: DC | PRN

## 2015-07-29 MED ORDER — FENTANYL CITRATE (PF) 100 MCG/2ML IJ SOLN
INTRAMUSCULAR | Status: AC
  Filled 2015-07-29: qty 4

## 2015-07-29 MED ORDER — CEFAZOLIN SODIUM-DEXTROSE 2-3 GM-% IV SOLR
INTRAVENOUS | Status: AC
  Filled 2015-07-29: qty 50

## 2015-07-29 MED ORDER — CHLORHEXIDINE GLUCONATE 4 % EX LIQD: 60.0000 mL | Freq: Once | CUTANEOUS | Status: DC

## 2015-07-29 MED ORDER — OXYCODONE HCL 5 MG PO TABS
5.0000 mg | ORAL_TABLET | Freq: Once | ORAL | Status: AC | PRN
  Administered 2015-07-29: 5 mg via ORAL

## 2015-07-29 MED ORDER — MIDAZOLAM HCL 2 MG/2ML IJ SOLN
1.0000 mg | INTRAMUSCULAR | Status: DC | PRN
  Administered 2015-07-29: 2 mg via INTRAVENOUS

## 2015-07-29 MED ORDER — OXYCODONE-ACETAMINOPHEN 5-325 MG PO TABS: 1.0000 | ORAL_TABLET | ORAL | Status: DC | PRN

## 2015-07-29 MED ORDER — DEXAMETHASONE SODIUM PHOSPHATE 10 MG/ML IJ SOLN
INTRAMUSCULAR | Status: AC
  Filled 2015-07-29: qty 1

## 2015-07-29 MED ORDER — OXYCODONE HCL 5 MG/5ML PO SOLN: 5.0000 mg | Freq: Once | ORAL | Status: AC | PRN

## 2015-07-29 MED ORDER — PROPOFOL 10 MG/ML IV BOLUS
INTRAVENOUS | Status: DC | PRN
  Administered 2015-07-29: 200 mg via INTRAVENOUS

## 2015-07-29 MED ORDER — FENTANYL CITRATE (PF) 100 MCG/2ML IJ SOLN
50.0000 ug | INTRAMUSCULAR | Status: DC | PRN
  Administered 2015-07-29: 100 ug via INTRAVENOUS

## 2015-07-29 MED ORDER — LACTATED RINGERS IV SOLN: INTRAVENOUS | Status: DC

## 2015-07-29 MED ORDER — LIDOCAINE HCL (CARDIAC) 20 MG/ML IV SOLN
INTRAVENOUS | Status: DC | PRN
  Administered 2015-07-29: 80 mg via INTRAVENOUS

## 2015-07-29 MED ORDER — ONDANSETRON HCL 4 MG/2ML IJ SOLN
INTRAMUSCULAR | Status: AC
  Filled 2015-07-29: qty 2

## 2015-07-29 MED ORDER — MEPERIDINE HCL 25 MG/ML IJ SOLN: 6.2500 mg | INTRAMUSCULAR | Status: DC | PRN

## 2015-07-29 MED ORDER — OXYCODONE HCL 5 MG PO TABS
ORAL_TABLET | ORAL | Status: AC
  Filled 2015-07-29: qty 1

## 2015-07-29 MED ORDER — METHYLPREDNISOLONE ACETATE 80 MG/ML IJ SUSP
INTRAMUSCULAR | Status: DC | PRN
  Administered 2015-07-29: 80 mg

## 2015-07-29 MED ORDER — SCOPOLAMINE 1 MG/3DAYS TD PT72: 1.0000 | MEDICATED_PATCH | Freq: Once | TRANSDERMAL | Status: DC | PRN

## 2015-07-29 MED ORDER — SODIUM CHLORIDE 0.9 % IR SOLN
Status: DC | PRN
  Administered 2015-07-29: 3000 mL

## 2015-07-29 MED ORDER — LIDOCAINE HCL (CARDIAC) 20 MG/ML IV SOLN
INTRAVENOUS | Status: AC
  Filled 2015-07-29: qty 5

## 2015-07-29 MED ORDER — CEFAZOLIN SODIUM-DEXTROSE 2-3 GM-% IV SOLR
2.0000 g | INTRAVENOUS | Status: AC
  Administered 2015-07-29: 2 g via INTRAVENOUS

## 2015-07-29 MED ORDER — HYDROMORPHONE HCL 1 MG/ML IJ SOLN
INTRAMUSCULAR | Status: AC
  Filled 2015-07-29: qty 1

## 2015-07-29 MED ORDER — LACTATED RINGERS IV SOLN
INTRAVENOUS | Status: DC
  Administered 2015-07-29: 10 mL/h via INTRAVENOUS

## 2015-07-29 MED ORDER — MIDAZOLAM HCL 2 MG/2ML IJ SOLN
INTRAMUSCULAR | Status: AC
  Filled 2015-07-29: qty 4

## 2015-07-29 SURGICAL SUPPLY — 38 items
BANDAGE ELASTIC 6 VELCRO ST LF (GAUZE/BANDAGES/DRESSINGS) ×2 IMPLANT
BLADE CUDA 5.5 (BLADE) IMPLANT
BLADE CUDA GRT WHITE 3.5 (BLADE) IMPLANT
BLADE CUTTER GATOR 3.5 (BLADE) ×2 IMPLANT
BLADE CUTTER MENIS 5.5 (BLADE) IMPLANT
BLADE GREAT WHITE 4.2 (BLADE) ×2 IMPLANT
BUR OVAL 4.0 (BURR) IMPLANT
CUTTER MENISCUS  4.2MM (BLADE)
CUTTER MENISCUS 4.2MM (BLADE) IMPLANT
DRAPE ARTHROSCOPY W/POUCH 90 (DRAPES) ×2 IMPLANT
DURAPREP 26ML APPLICATOR (WOUND CARE) ×2 IMPLANT
ELECT MENISCUS 165MM 90D (ELECTRODE) ×1 IMPLANT
ELECT REM PT RETURN 9FT ADLT (ELECTROSURGICAL) ×2
ELECTRODE REM PT RTRN 9FT ADLT (ELECTROSURGICAL) IMPLANT
GAUZE SPONGE 4X4 12PLY STRL (GAUZE/BANDAGES/DRESSINGS) ×4 IMPLANT
GAUZE XEROFORM 1X8 LF (GAUZE/BANDAGES/DRESSINGS) ×2 IMPLANT
GLOVE BIO SURGEON STRL SZ 6.5 (GLOVE) ×1 IMPLANT
GLOVE BIOGEL PI IND STRL 7.0 (GLOVE) ×1 IMPLANT
GLOVE BIOGEL PI INDICATOR 7.0 (GLOVE) ×3
GLOVE ECLIPSE 7.0 STRL STRAW (GLOVE) ×2 IMPLANT
GLOVE SURG ORTHO 8.0 STRL STRW (GLOVE) ×2 IMPLANT
GOWN STRL REUS W/ TWL LRG LVL3 (GOWN DISPOSABLE) ×2 IMPLANT
GOWN STRL REUS W/ TWL XL LVL3 (GOWN DISPOSABLE) ×1 IMPLANT
GOWN STRL REUS W/TWL LRG LVL3 (GOWN DISPOSABLE) ×4
GOWN STRL REUS W/TWL XL LVL3 (GOWN DISPOSABLE) ×2
HOLDER KNEE FOAM BLUE (MISCELLANEOUS) ×2 IMPLANT
IV NS IRRIG 3000ML ARTHROMATIC (IV SOLUTION) ×8 IMPLANT
KNEE WRAP E Z 3 GEL PACK (MISCELLANEOUS) ×1 IMPLANT
MANIFOLD NEPTUNE II (INSTRUMENTS) ×2 IMPLANT
PACK ARTHROSCOPY DSU (CUSTOM PROCEDURE TRAY) ×2 IMPLANT
PACK BASIN DAY SURGERY FS (CUSTOM PROCEDURE TRAY) ×2 IMPLANT
PENCIL BUTTON HOLSTER BLD 10FT (ELECTRODE) IMPLANT
SET ARTHROSCOPY TUBING (MISCELLANEOUS) ×2
SET ARTHROSCOPY TUBING LN (MISCELLANEOUS) ×1 IMPLANT
SUT ETHILON 3 0 PS 1 (SUTURE) ×2 IMPLANT
SUT VIC AB 3-0 FS2 27 (SUTURE) IMPLANT
TOWEL OR 17X24 6PK STRL BLUE (TOWEL DISPOSABLE) ×2 IMPLANT
WATER STERILE IRR 1000ML POUR (IV SOLUTION) ×2 IMPLANT

## 2015-07-29 NOTE — Anesthesia Preprocedure Evaluation (Signed)

## 2015-07-29 NOTE — Transfer of Care (Signed)
Immediate Anesthesia Transfer of Care Note  Patient: Courtney [name]  Procedure(s) Performed: Procedure(s): RIGHT KNEE ARTHROSCOPY CHONDROPLASTY (Right) LIMITED SYNOVECTOMY PLICA  (Right)  Patient Location: PACU  Anesthesia Type:General  Level of Consciousness: awake, alert  and patient cooperative  Airway & Oxygen Therapy: Patient Spontanous Breathing and Patient connected to face mask oxygen  Post-op Assessment: Report given to RN, Post -op Vital signs reviewed and stable and Patient moving all extremities  Post vital signs: Reviewed and stable  Last Vitals:  Filed Vitals:   07/29/15 0725  BP: 123/66  Pulse: 66  Temp: 36.6 C  Resp: 16    Complications: No apparent anesthesia complications

## 2015-07-29 NOTE — Anesthesia Procedure Notes (Addendum)
Procedure Name: LMA Insertion Date/Time: 07/29/2015 9:19 AM Performed by: Baxter Flattery Pre-anesthesia Checklist: Patient identified, Emergency Drugs available, Suction available and Patient being monitored Patient Re-evaluated:Patient Re-evaluated prior to inductionOxygen Delivery Method: Circle System Utilized Preoxygenation: Pre-oxygenation with 100% oxygen Intubation Type: IV induction Ventilation: Mask ventilation without difficulty LMA: LMA inserted LMA Size: 3.0 Number of attempts: 1 Airway Equipment and Method: Bite block and LTA kit utilized Placement Confirmation: positive ETCO2 Tube secured with: Tape Dental Injury: Teeth and Oropharynx as per pre-operative assessment

## 2015-07-29 NOTE — Anesthesia Postprocedure Evaluation (Signed)
  Anesthesia Post-op Note  Patient: Courtney [name]  Procedure(s) Performed: Procedure(s): RIGHT KNEE ARTHROSCOPY CHONDROPLASTY WITH LATERAL RELEASE (Right) LIMITED SYNOVECTOMY PLICA  (Right)  Patient Location: PACU  Anesthesia Type: General   Level of Consciousness: awake, alert  and oriented  Airway and Oxygen Therapy: Patient Spontanous Breathing  Post-op Pain: mild  Post-op Assessment: Post-op Vital signs reviewed  Post-op Vital Signs: Reviewed  Last Vitals:  Filed Vitals:   07/29/15 1030  BP: 122/70  Pulse: 83  Temp:   Resp: 17    Complications: No apparent anesthesia complications

## 2015-07-29 NOTE — Discharge Instructions (Signed)
Discharge Instructions after Knee Arthroscopy   You will have a light dressing on your knee.  Do not remove bandages Do not get bandages wet You may begin gentle motion of your leg immediately after surgery. Pump your foot up and down 20 times per hour, every hour you are awake.  Apply ice to the knee 3 times per day for 30 minutes for the first 1 week until your knee is feeling comfortable again. Do not use heat.  You may begin straight leg raising exercises (if you have a brace with it on). While lying down, pull your foot all the way up, tighten your quadriceps muscle and lift your heel off of the ground. Hold this position for 2 seconds, and then let the leg back down. Repeat the exercise 10 times, at least 3 times a day.  Pain medicine has been prescribed for you.  Use your medicine as needed over the first 48 hours, and then you can begin to taper your use. You may take Extra Strength Tylenol or Tylenol only in place of the pain pills.    Please call [phone] for any problems. Including the following:  - excessive redness of the incisions - drainage for more than 4 days - fever of more than 101.5 F  *Please note that pain medications will not be refilled after hours or on weekends.   Post Anesthesia Home Care Instructions  Activity: Get plenty of rest for the remainder of the day. A responsible adult should stay with you for 24 hours following the procedure.  For the next 24 hours, DO NOT: -Drive a car -Advertising copywriterperate machinery -Drink alcoholic beverages -Take any medication unless instructed by your physician -Make any legal decisions or sign important papers.  Meals: Start with liquid foods such as gelatin or soup. Progress to regular foods as tolerated. Avoid greasy, spicy, heavy foods. If nausea and/or vomiting occur, drink only clear liquids until the nausea and/or vomiting subsides. Call your physician if vomiting continues.  Special Instructions/Symptoms: Your throat  may feel dry or sore from the anesthesia or the breathing tube placed in your throat during surgery. If this causes discomfort, gargle with warm salt water. The discomfort should disappear within 24 hours.  If you had a scopolamine patch placed behind your ear for the management of post- operative nausea and/or vomiting:  1. The medication in the patch is effective for 72 hours, after which it should be removed.  Wrap patch in a tissue and discard in the trash. Wash hands thoroughly with soap and water. 2. You may remove the patch earlier than 72 hours if you experience unpleasant side effects which may include dry mouth, dizziness or visual disturbances. 3. Avoid touching the patch. Wash your hands with soap and water after contact with the patch.

## 2015-07-29 NOTE — Interval H&P Note (Signed)
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 After consideration of risks, benefits and other options for treatment, the patient has consented to  Procedure(s): RIGHT KNEE ARTHROSCOPY CHONDROPLASTY (Right) LIMITED SYNOVECTOMY PLICA  (Right) as a surgical intervention .  The patient's history has been reviewed, patient examined, no change in status, stable for surgery.  I have reviewed the patient's chart and labs.  Questions were answered to the patient's satisfaction.     Loreta Aveaniel F Mya Suell

## 2015-07-30 ENCOUNTER — Encounter (HOSPITAL_BASED_OUTPATIENT_CLINIC_OR_DEPARTMENT_OTHER): Payer: Self-pay | Admitting: Orthopedic Surgery

## 2015-07-30 NOTE — Op Note (Signed)
NAMKarrie Meres:  Gramajo, Tanelle                    ACCOUNT NO.:  [id number]  MEDICAL RECORD NO.:  [id number]  LOCATION:                               FACILITY:  [facility]  PHYSICIAN:  Loreta Aveaniel F. Ravin Denardo, M.D. DATE OF BIRTH:  01-16-1991  DATE OF PROCEDURE:  07/29/2015 DATE OF DISCHARGE:  07/29/2015                              OPERATIVE REPORT   PREOPERATIVE DIAGNOSES:  Right knee lateral patellofemoral tracking and tethering with the lateral chondromalacia.  Synovitis of medial plica. Inflammation in Hoffa fat pad.  POSTOPERATIVE DIAGNOSES:  Right knee lateral patellofemoral tracking and tethering with the lateral chondromalacia.  Synovitis of medial plica. Inflammation in Hoffa fat pad.  PROCEDURE:  Right knee exam under anesthesia with arthroscopy. Chondroplasty patella.  Excision of plica and hypertrophic synovitis from fat pad.  Arthroscopic lateral retinacular release.  SURGEON:  Loreta Aveaniel F. Marge Vandermeulen, M.D.  ASSISTANT:  Mikey KirschnerLindsey Stanberry, PA.  ANESTHESIA:  General.  BLOOD LOSS:  Minimal.  SPECIMENS:  None.  COUNTS:  None.  CULTURES:  None.  COMPLICATIONS:  None.  DRESSINGS:  Soft compressive with a lateral bolster, tourniquet not employed.  DESCRIPTION OF PROCEDURE:  The patient was brought to operating room, placed on the operating table in supine position.  After adequate anesthesia had been obtained, leg holder applied.  Leg prepped and draped in usual sterile fashion.  Confirmed lateral tracking tethering. Two portals, one each medial and lateral parapatellar.  Arthroscope was induced, knee distended and inspected.  Marked lateral patellofemoral positioning tracking tethering.  Some grade 2 chondromalacia peak of patella lateral facet debrided.  Large fibrotic medial plica with marked synovitis hold onto medial side down with a fat pad.  All of this excised.  Medial meniscus, medial compartment, lateral meniscus, lateral compartment, cruciate ligaments normal.  The scope was then  placed medially.  With cautery, I then did a lateral release with the vastus lateralis superiorly down the lateral joint line. Marked improvement of tethering confirmed.  Hemostasis with cautery. Instruments were removed.  Portals were closed with nylon.  Sterile compressive dressing applied with a lateral bolster.  Anesthesia reversed.  Brought to the recovery room.  Tolerated the surgery well. No complications.     Loreta Aveaniel F. Ladye Macnaughton, M.D.     DFM/MEDQ  D:  07/29/2015  T:  07/29/2015  Job:  [id number]

## 2016-01-03 DIAGNOSIS — F411 Generalized anxiety disorder: Secondary | ICD-10-CM | POA: Diagnosis not present

## 2016-02-15 DIAGNOSIS — F411 Generalized anxiety disorder: Secondary | ICD-10-CM | POA: Diagnosis not present

## 2016-03-29 DIAGNOSIS — K080 Exfoliation of teeth due to systemic causes: Secondary | ICD-10-CM | POA: Diagnosis not present

## 2016-04-10 DIAGNOSIS — F411 Generalized anxiety disorder: Secondary | ICD-10-CM | POA: Diagnosis not present

## 2016-11-16 ENCOUNTER — Encounter: Payer: Self-pay | Admitting: Nurse Practitioner

## 2016-11-23 ENCOUNTER — Encounter (INDEPENDENT_AMBULATORY_CARE_PROVIDER_SITE_OTHER): Payer: Self-pay

## 2016-11-23 ENCOUNTER — Other Ambulatory Visit (INDEPENDENT_AMBULATORY_CARE_PROVIDER_SITE_OTHER): Payer: Federal, State, Local not specified - PPO

## 2016-11-23 ENCOUNTER — Ambulatory Visit (INDEPENDENT_AMBULATORY_CARE_PROVIDER_SITE_OTHER): Payer: Federal, State, Local not specified - PPO | Admitting: Nurse Practitioner

## 2016-11-23 ENCOUNTER — Encounter: Payer: Self-pay | Admitting: Nurse Practitioner

## 2016-11-23 VITALS — BP 100/68 | HR 72 | Ht 65.5 in | Wt 179.0 lb

## 2016-11-23 DIAGNOSIS — R197 Diarrhea, unspecified: Secondary | ICD-10-CM

## 2016-11-23 DIAGNOSIS — R140 Abdominal distension (gaseous): Secondary | ICD-10-CM

## 2016-11-23 LAB — COMPREHENSIVE METABOLIC PANEL
ALT: 16 U/L (ref 0–35)
AST: 15 U/L (ref 0–37)
Albumin: 4.5 g/dL (ref 3.5–5.2)
Alkaline Phosphatase: 77 U/L (ref 39–117)
BUN: 16 mg/dL (ref 6–23)
CHLORIDE: 104 meq/L (ref 96–112)
CO2: 30 meq/L (ref 19–32)
CREATININE: 0.77 mg/dL (ref 0.40–1.20)
Calcium: 9.9 mg/dL (ref 8.4–10.5)
GFR: 119.12 mL/min (ref >60.00)
GLUCOSE: 83 mg/dL (ref 70–99)
Potassium: 4.4 mEq/L (ref 3.5–5.1)
SODIUM: 139 meq/L (ref 135–145)
Total Bilirubin: 0.4 mg/dL (ref 0.2–1.2)
Total Protein: 7.7 g/dL (ref 6.0–8.3)

## 2016-11-23 LAB — CBC
HCT: 37.7 % (ref 36.0–46.0)
Hemoglobin: 12.8 g/dL (ref 12.0–15.0)
MCHC: 33.9 g/dL (ref 30.0–36.0)
MCV: 89.1 fl (ref 78.0–100.0)
Platelets: 266 10*3/uL (ref 150.0–400.0)
RBC: 4.23 Mil/uL (ref 3.87–5.11)
RDW: 13.8 % (ref 11.5–15.5)
WBC: 8.1 10*3/uL (ref 4.0–10.5)

## 2016-11-23 LAB — SEDIMENTATION RATE: Sed Rate: 29 mm/hr — ABNORMAL HIGH (ref 0–20)

## 2016-11-23 LAB — HIGH SENSITIVITY CRP: CRP, High Sensitivity: 10.78 mg/L — ABNORMAL HIGH (ref 0.000–5.000)

## 2016-11-23 NOTE — Patient Instructions (Addendum)
Please purchase the following medications over the counter and take as directed: Imodium twice daily as needed  Your physician has requested that you go to the basement for the following lab work before leaving today: CBC, CMET, Sed, CRP, C Diff, Lactoferrin, O&P   We have given you literature on IBS and gas to look over.  Please follow up with Courtney ClusterPaula Guenther, NP on Thursday, 12/14/16 @ 9:00 am.  If you are age 865 or older, your body mass index should be between 23-30. Your Body mass index is 29.33 kg/m. If this is out of the aforementioned range listed, please consider follow up with your Primary Care Provider.  If you are age 24 or younger, your body mass index should be between 19-25. Your Body mass index is 29.33 kg/m. If this is out of the aformentioned range listed, please consider follow up with your Primary Care Provider.

## 2016-11-23 NOTE — Progress Notes (Addendum)
    HPI: Patient is a 25-year-old female, new to this practice and self-referred for evaluation of diarrhea and bloating. She has actually had chronic constipation for very long time but this changed around November. She is a student and works at [employer]. She has been trying to lose weight with success. She has not been using artificial sweeteners, no dietary changes except elimination of fatty foods for weight loss. She takes no vitamins or herbal products or diet pills. Her bowel habits are characterized as urgent, liquid to mush up to 5 times a day. She does complain of excessive gas and bloating.  She hasn't seen any blood.  No nocturnal stooling, bowel movements are usually postprandial. No cramping unless she has to defer bowel movement at work. No nausea or vomiting. No rashes, arthralgias.   Past Medical History:  Diagnosis Date  . Anemia    no current med.  . 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 Complication of anesthesia    states was told she "slept longer in recovery than most people"  . Osteoarthritis of right knee 07/2015  . 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 KNEE ARTHROSCOPY WITH LATERAL RELEASE Right 07/29/2015   Procedure: RIGHT KNEE ARTHROSCOPY CHONDROPLASTY WITH LATERAL RELEASE;  Surgeon: Daniel F Murphy, MD;  Location: [facility];  Service: Orthopedics;  Laterality: Right;  . LAPAROSCOPY N/A 04/30/2014   Procedure: LAPAROSCOPY DIAGNOSTIC;  Surgeon: Evelyn Varnado, MD;  Location: [facility];  Service: Gynecology;  Laterality: N/A;  . SYNOVECTOMY Right 07/29/2015   Procedure: LIMITED SYNOVECTOMY PLICA ;  Surgeon: Daniel F Murphy, MD;  Location: [facility];  Service: Orthopedics;  Laterality: Right;   History reviewed. No pertinent family history. Social History  Substance Use Topics  . Smoking status: Never Smoker  . Smokeless tobacco: Never Used  . Alcohol use No    Current Outpatient Prescriptions  Medication Sig Dispense Refill  . levonorgestrel (MIRENA) 20 MCG/24HR IUD 1 each by Intrauterine route once.    . sertraline (ZOLOFT) 100 MG tablet Take 100 mg by mouth at bedtime.     No current facility-administered medications for this visit.    No Known Allergies   Review of Systems: Positive for anxiety, depression, and hearing problems. All other systems reviewed and negative except where noted in HPI.    Physical Exam: BP 100/68   Pulse 72   Ht 5' 5.5" (1.664 m)   Wt 179 lb (81.2 kg)   BMI 29.33 kg/m  Constitutional:  Well-developed, black female in no acute distress. Psychiatric: Pleasant, normal mood and affect. Behavior is normal. HEENT: Normocephalic and atraumatic. Conjunctivae are normal. No scleral icterus. Neck supple.  Cardiovascular: Normal rate, regular rhythm.  Pulmonary/chest: Effort normal and breath sounds normal. No wheezing, rales or rhonchi. Abdominal: Soft, nondistended, nontender. Bowel sounds active throughout. There are no masses palpable. No hepatomegaly. Extremities: no edema Lymphadenopathy: No cervical adenopathy noted. Neurological: Alert and oriented to person place and time. Skin: Skin is warm and dry. No rashes noted.   ASSESSMENT AND PLAN:   25-year-old female with bowel habit changes since November. Previously with chronic constipation, now with frequent stools varying in consistency from liquid to mush. She has associated gas and bloating. No alarm features such as unintentional weight loss or blood in stool. No abdominal pain. Unable to correlate bowel changes with diet, medications, stress, or anything else.  -Etiology of bowel changes   not yet apparent. Will check stool studies, including an O&P given duration -No labs since 2015 so will check CBC, CMET, ESR, CRP -Until things can be sorted out patient can take an Imodium 1 to 2 times a day as needed -IBS brochure and gas brochure given -I will  see her back in approximately 3 weeks to go over results and see how things are going.   Tye Savoy, NP  11/23/2016, 8:48 AM   Agree with Ms. Jannetta Massey's assessment and plan.  Labs showed negative stool studies including lactoferrin but she did have mild elevations CRP and ESR Would have expected lactoferrin to be + if IBD but only one specimen. If she has persistent problems a colonoscopy is reasonable. Celiac disease would seem unlikely to me.  Gatha Mayer, MD, [facility]

## 2016-11-29 ENCOUNTER — Other Ambulatory Visit: Payer: Federal, State, Local not specified - PPO

## 2016-11-29 DIAGNOSIS — R197 Diarrhea, unspecified: Secondary | ICD-10-CM

## 2016-11-29 DIAGNOSIS — R140 Abdominal distension (gaseous): Secondary | ICD-10-CM | POA: Diagnosis not present

## 2016-11-30 LAB — CLOSTRIDIUM DIFFICILE BY PCR: Toxigenic C. Difficile by PCR: NOT DETECTED

## 2016-11-30 LAB — FECAL LACTOFERRIN, QUANT: Lactoferrin: NEGATIVE

## 2016-12-02 LAB — OVA AND PARASITE EXAMINATION: OP: NONE SEEN

## 2016-12-14 ENCOUNTER — Ambulatory Visit (INDEPENDENT_AMBULATORY_CARE_PROVIDER_SITE_OTHER): Payer: Federal, State, Local not specified - PPO | Admitting: Nurse Practitioner

## 2016-12-14 ENCOUNTER — Encounter: Payer: Self-pay | Admitting: Nurse Practitioner

## 2016-12-14 ENCOUNTER — Other Ambulatory Visit (INDEPENDENT_AMBULATORY_CARE_PROVIDER_SITE_OTHER): Payer: Federal, State, Local not specified - PPO

## 2016-12-14 VITALS — BP 98/68 | HR 79 | Ht 65.5 in | Wt 182.0 lb

## 2016-12-14 DIAGNOSIS — R194 Change in bowel habit: Secondary | ICD-10-CM

## 2016-12-14 LAB — HIGH SENSITIVITY CRP: CRP HIGH SENSITIVITY: 7.19 mg/L — AB (ref 0.000–5.000)

## 2016-12-14 NOTE — Progress Notes (Addendum)
     HPI: Patient is a 26 year old female who I saw late February for an initial evaluation of diarrhea and bloating. Patient had actually suffered with chronic constipation but she developed loose stools around November. Lab work reveals elevated CRP of 10, ESR 29. Stool for C. difficile, O&P and lactoferrin are negative. She is here for follow up. Diarrhea stopped 9 days ago and she had a couple of days of constipation. Now for the last several days she has been having soft stools alternating with formed stools. Having 3-7 BMs a day. The loose stools are not related to any certain foods, at least not consistently. No abdominal pain. No blood in stools. No fevers. No arthralgias. No skin rashes. Her appetite is good. She does still complain of frequent bloating   Past Medical History:  Diagnosis Date  . Anemia    no current med.  . 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 Complication of anesthesia    states was told she "slept longer in recovery than most people"  . Osteoarthritis of right knee 07/2015  . Plica syndrome of right knee 07/2015    Patient's surgical history, family medical history, social history, medications and allergies were all reviewed in Epic    Physical Exam: BP 98/68   Pulse 79   Ht 5' 5.5" (1.664 m)   Wt 182 lb (82.6 kg)   BMI 29.83 kg/m   GENERAL: Black female in NAD PSYCH: :Pleasant, cooperative, normal affect HEENT: Normocephalic, conjunctiva pink, mucous membranes moist, neck supple without masses CARDIAC:  RRR, no murmur heard, no peripheral edema PULM: Normal respiratory effort, lungs CTA bilaterally, no wheezing ABDOMEN:  soft, nontender, nondistended, no obvious masses, no hepatomegaly,  normal bowel sounds SKIN:  turgor, no lesions seen Musculoskeletal:  Normal muscle tone, normal strength NEURO: Alert and oriented x 3, no focal neurologic deficits  ASSESSMENT and PLAN:  42. 26 year old female here for with boating and diarrhea since  around November. I saw her a few weeks ago. Her CRP was elevated at 10 on recent labs. CBC and chem profile unremarkable and stool for c-diff, 0+P, lactoferrin were negative.  No loose stools in over a week but still not back to her baseline.  IBS is still working diagnosis but that doesn't explain elevated CRP. IBD seems unlikely but still on DDx list.  -Recheck CRP today.  -I have asked her to call with a condition update in a couple of weeks, or sooner if things don't continue to improve. We discussed colonoscopy and she isn't opposed to having one depending on how things go.   2. Anxiety, stable. Sees a therapist     Tye Savoy , NP 12/14/2016, 9:23 AM  Agree with Ms. Guenther's assessment and plan. Gatha Mayer, MD, [facility]

## 2016-12-14 NOTE — Patient Instructions (Addendum)
If you are age 24 or older, your body mass index should be between 23-30. Your Body mass index is 29.83 kg/m. If this is out of the aforementioned range listed, please consider follow up with your Primary Care Provider.  If you are age 24 or younger, your body mass index should be between 19-25. Your Body mass index is 29.83 kg/m. If this is out of the aformentioned range listed, please consider follow up with your Primary Care Provider.   Please call Courtney [name] in 2 weeks with an update.  Your physician has requested that you go to the basement for the following lab work before leaving today:  CRP  Thank you.

## 2017-01-04 ENCOUNTER — Telehealth: Payer: Self-pay | Admitting: Nurse Practitioner

## 2017-01-04 NOTE — Telephone Encounter (Signed)
Spoke with the patient about her symptoms. She has gone again from diarrhea, loose stools to being constipated. She is straining for her bowel movement and does not feel she has completely emptied. The bowel movement is hard. She has purchased "Super Cleanse" because she is at work and she is uncomfortable. She has off and on "tummy aches". She does not notice any excessive flatulence.

## 2017-01-09 ENCOUNTER — Telehealth: Payer: Self-pay | Admitting: Nurse Practitioner

## 2017-01-09 NOTE — Telephone Encounter (Signed)
Pts mother states her daughter is still having abdominal pain and bloating. Reports she called several days ago and never received a call back. Pts mother would like her to be seen today. Discussed with her that we can see her tomorrow. Pt scheduled to see Hyacinth Meeker PA tomorrow at 2:15pm. Pts mother aware of appt.

## 2017-01-10 ENCOUNTER — Encounter: Payer: Self-pay | Admitting: Internal Medicine

## 2017-01-10 ENCOUNTER — Encounter (INDEPENDENT_AMBULATORY_CARE_PROVIDER_SITE_OTHER): Payer: Self-pay

## 2017-01-10 ENCOUNTER — Ambulatory Visit (INDEPENDENT_AMBULATORY_CARE_PROVIDER_SITE_OTHER): Payer: Federal, State, Local not specified - PPO | Admitting: Physician Assistant

## 2017-01-10 ENCOUNTER — Encounter: Payer: Self-pay | Admitting: Physician Assistant

## 2017-01-10 VITALS — BP 126/82 | HR 102 | Ht 65.5 in | Wt 186.0 lb

## 2017-01-10 DIAGNOSIS — R109 Unspecified abdominal pain: Secondary | ICD-10-CM | POA: Diagnosis not present

## 2017-01-10 DIAGNOSIS — R7982 Elevated C-reactive protein (CRP): Secondary | ICD-10-CM | POA: Diagnosis not present

## 2017-01-10 DIAGNOSIS — R194 Change in bowel habit: Secondary | ICD-10-CM | POA: Diagnosis not present

## 2017-01-10 MED ORDER — HYOSCYAMINE SULFATE 0.125 MG SL SUBL: SUBLINGUAL_TABLET | SUBLINGUAL | 1 refills | Status: DC

## 2017-01-10 MED ORDER — NA SULFATE-K SULFATE-MG SULF 17.5-3.13-1.6 GM/177ML PO SOLN: 1.0000 | Freq: Once | ORAL | 0 refills | Status: AC

## 2017-01-10 NOTE — Telephone Encounter (Signed)
Mother of the patient called yesterday and scheduled Courtney [name] an appointment for today.

## 2017-01-10 NOTE — Progress Notes (Addendum)
Chief Complaint: Abdominal Pain, Change in bowel habits, Weight change  HPI:  Courtney [name] is a 26 year old African-American female who has been following with Tye Savoy, NP in our clinic, for complaint of change in bowel habits including diarrhea and constipation as well as bloating who presents to clinic today for follow-up of those complaints as well as abdominal pain and weight change.   Patient was initially seen by Courtney [name] on 11/23/16 and had suffered from chronic constipation but had developed loose stools around November. She had lab work which revealed an elevated CRP of 10, ESR 29. Stool for C. difficile, O&P and lactoferrin were negative. At time of follow up, the patient described her diarrhea had stopped 9 days ago when she had a couple of days of constipation. For the past few days she would have a soft stools alternating with formed stools having 3-7 bowel movements a day. Continued with bloating at that time. It was discussed that the patient's CBC and chem profile were unremarkable and she had had no loose stools inover a week but was not at her baseline. IBS was a working diagnosis but didn't explain elevated CRP. CRP was rechecked that day and was continually elevated at 7.   Since that visit the patient called our clinic on 01/04/17 and describes that she went to severe constipation noting that she was straining for bowel movements and did not feel completely empty. Her bowel movements were hard. She also described off and on tummy aches at that time. She had purchased "super cleanse" to try and take. Courtney [name] recommend that she increase her water and fiber intake with possibly Citrucel and add MiraLAX if she were constipated. Patient's mother called and reported patient was still having abdominal pain and bloating on 01/09/17 and requested a sooner appointment.   Today, the patient presents to clinic and is somewhat of a poor historian, she is hard to follow as far as timing of  her symptoms. She notes that she has changed overall from diarrhea to now constipation, though she did have a good bowel movement yesterday and had a smaller bowel movement the day before. Patient tells me that Thursday of last week she was having constipation and took a colon cleanse, an enema and a suppository and also took multiple mag citrate tablets. This did not result in a bowel movement until yesterday. The patient notes that she continues with severe bloating. Apparently she only ate "3 grapes" for a meal and became so bloated that she did not feel fit in her uniform for work. She has also been radiating in weight from as high as 195 at some point last week now back down to 186. She tells me she was working at losing weight before all of this and is not even back to where she started at this point. Associated with this the patient has developed some abdominal cramping, it is hard to tell exactly when this started, the patient notes that it "come in waves", sometimes it hits her at work and she has to just sit still/bend over for a minute until it passes, other times it will last longer. This has not changed with passing gas or having a bowel movement.   Patient denies fever, chills, blood in her stool, melena, fatigue, anorexia, nausea, vomiting, heartburn, reflux or symptoms that awaken her at night.  Past Medical History:  Diagnosis Date  . Anemia    no current med.  . Chondromalacia patellae of right knee  07/2015  . Complication of anesthesia    states was told she "slept longer in recovery than most people"  . Osteoarthritis of right knee 07/2015  . 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 KNEE ARTHROSCOPY WITH LATERAL RELEASE Right 07/29/2015   Procedure: RIGHT KNEE ARTHROSCOPY CHONDROPLASTY WITH LATERAL RELEASE;  Surgeon: Ninetta Lights, MD;  Location: [facility];  Service: Orthopedics;  Laterality: Right;  .  LAPAROSCOPY N/A 04/30/2014   Procedure: LAPAROSCOPY DIAGNOSTIC;  Surgeon: Thurnell Lose, MD;  Location: [facility];  Service: Gynecology;  Laterality: N/A;  . SYNOVECTOMY Right 07/29/2015   Procedure: LIMITED SYNOVECTOMY PLICA ;  Surgeon: Ninetta Lights, MD;  Location: [facility];  Service: Orthopedics;  Laterality: Right;    Current Outpatient Prescriptions  Medication Sig Dispense Refill  . levonorgestrel (MIRENA) 20 MCG/24HR IUD 1 each by Intrauterine route once.    . sertraline (ZOLOFT) 100 MG tablet Take 200 mg by mouth at bedtime.     . hyoscyamine (LEVSIN SL) 0.125 MG SL tablet Take one every 4-6 hours as needed for abdominal cramping 30 tablet 1  . Na Sulfate-K Sulfate-Mg Sulf 17.5-3.13-1.6 GM/180ML SOLN Take 1 kit by mouth once. 354 mL 0   No current facility-administered medications for this visit.     Allergies as of 01/10/2017  . (No Known Allergies)    History reviewed. No pertinent family history.  Social History   Social History  . Marital status: Single    Spouse name: N/A  . Number of children: 0  . Years of education: N/A   Occupational History  . Not on file.   Social History Main Topics  . Smoking status: Never Smoker  . Smokeless tobacco: Never Used  . Alcohol use No  . Drug use: No  . Sexual activity: Yes    Birth control/ protection: IUD   Other Topics Concern  . Not on file   Social History Narrative  . No narrative on file    Review of Systems:    Constitutional: No weight loss, fever or chills Cardiovascular: No chest pain Respiratory: No SOB  Gastrointestinal: See HPI and otherwise negative   Physical Exam:  Vital signs: BP 126/82   Pulse (!) 102   Ht 5' 5.5" (1.664 m)   Wt 186 lb (84.4 kg)   BMI 30.48 kg/m   Constitutional:   Pleasant African American female appears to be in NAD, Well developed, Well nourished, alert and cooperative Respiratory: Respirations even and unlabored. Lungs clear to auscultation  bilaterally.   No wheezes, crackles, or rhonchi.  Cardiovascular: Normal S1, S2. No MRG. Regular rate and rhythm. No peripheral edema, cyanosis or pallor.  Gastrointestinal:  Soft, mild distension, nontender. No rebound or guarding. Normal bowel sounds. No appreciable masses or hepatomegaly. Psychiatric: Demonstrates good judgement and reason without abnormal affect or behaviors.  MOST RECENT LABS: CBC    Component Value Date/Time   WBC 8.1 11/23/2016 0931   RBC 4.23 11/23/2016 0931   HGB 12.8 11/23/2016 0931   HCT 37.7 11/23/2016 0931   PLT 266.0 11/23/2016 0931   MCV 89.1 11/23/2016 0931   MCH 30.5 04/30/2014 1325   MCHC 33.9 11/23/2016 0931   RDW 13.8 11/23/2016 0931    CMP     Component Value Date/Time   NA 139 11/23/2016 0931   K 4.4 11/23/2016 0931   CL 104 11/23/2016 0931   CO2 30 11/23/2016  0931   GLUCOSE 83 11/23/2016 0931   BUN 16 11/23/2016 0931   CREATININE 0.77 11/23/2016 0931   CALCIUM 9.9 11/23/2016 0931   PROT 7.7 11/23/2016 0931   ALBUMIN 4.5 11/23/2016 0931   AST 15 11/23/2016 0931   ALT 16 11/23/2016 0931   ALKPHOS 77 11/23/2016 0931   BILITOT 0.4 11/23/2016 0931    Ref Range & Units 3wk ago 52moago    CRP, High Sensitivity 0.000 - 5.000 mg/L 7.190   10.780CM     Assessment: 1. Change in bowel habits: Patient has radiated from diarrhea with negative stool studies to constipation and is now somewhere in between; Consider relation to IBS 2. Abdominal pain: Over the past week for the patient, sometimes debilitating, comes in "waves"; etc. Consider relation to suspected IBS versus other 3. Elevated CRP: See above, question relation to possible IBD, the patient does not describe diarrhea currently, but is having abdominal pain  Plan: 1. Discussed with patient that due to her continuation of symptoms and now abdominal pain and growing anxiety, recommend that we go ahead and schedule a colonoscopy.  2. [facility] Did  discuss risks, benefits, limitations and alternatives the patient agrees to proceed.  3. Prescribe Hyoscyamine sulfate 0.125 mg every 4-6 hours for abdominal pain. We did discuss that this can have a tendency to cause constipation, if the patient notices this she should back off. 4. Reviewed [name]'s recommendations of MiraLAX, fiber and water if the patient is constipated, would recommend this instead of magnesium which can sometimes cause abdominal cramping 5. Patient to follow in clinic per recommendations from Dr. GCarlean Purlafter time of procedure, likley with [name] as she has been following the patient  JEllouise Newer PA-C [facility] 01/10/2017, 2:55 PM  Cc: RLennie Odor PA-C   Agree with Ms. LMort Sawyersevaluation and management.

## 2017-01-10 NOTE — Telephone Encounter (Signed)
Okay, she has chronic constipation. I don't know what the super cleanse thing is. I would prefer her to get on a maintenance regimen for constipation. Please make sure she is getting at least 6 glasses a water a day. If she doesn't eat a high fiber diet then lets start daily Citrucel. In addition she can add daily MIralax. If she is already doing these things then we can talk about Linzess or one of the other Rx medications. Ask her not to strain, may cause hemorrhoids. She can use glycerin supp as needed to empty rectum. Thanks

## 2017-01-10 NOTE — Patient Instructions (Signed)
If you are age 24 or older, your body mass index should be between 23-30. Your Body mass index is 30.48 kg/m. If this is out of the aforementioned range listed, please consider follow up with your Primary Care Provider.  If you are age 27 or younger, your body mass index should be between 19-25. Your Body mass index is 30.48 kg/m. If this is out of the aformentioned range listed, please consider follow up with your Primary Care Provider.   You have been scheduled for a colonoscopy. Please follow written instructions given to you at your visit today.  Please pick up your prep supplies at the pharmacy within the next 1-3 days. If you use inhalers (even only as needed), please bring them with you on the day of your procedure. Your physician has requested that you go to www.startemmi.com and enter the access code given to you at your visit today. This web site gives a general overview about your procedure. However, you should still follow specific instructions given to you by our office regarding your preparation for the procedure.  We have sent the following medications to your pharmacy for you to pick up at your convenience: Suprep Hyoscyamine  Thank you for choosing me and [facility].  Hyacinth Meeker, PA-C

## 2017-01-11 ENCOUNTER — Ambulatory Visit: Payer: Federal, State, Local not specified - PPO | Admitting: Nurse Practitioner

## 2017-01-15 ENCOUNTER — Encounter: Payer: Federal, State, Local not specified - PPO | Admitting: Internal Medicine

## 2017-01-15 ENCOUNTER — Telehealth: Payer: Self-pay | Admitting: Internal Medicine
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
## 2017-01-15 NOTE — Telephone Encounter (Signed)
Ok no charge

## 2017-06-08 ENCOUNTER — Ambulatory Visit (AMBULATORY_SURGERY_CENTER): Payer: Self-pay | Admitting: *Deleted

## 2017-06-08 VITALS — Ht 65.5 in | Wt 183.8 lb

## 2017-06-08 DIAGNOSIS — R194 Change in bowel habit: Secondary | ICD-10-CM

## 2017-06-08 NOTE — Progress Notes (Signed)
No egg or soy allergy known to patient  No issues with past sedation with any surgeries  or procedures, no intubation problems - hard to wake post op but no issues  No diet pills per patient No home 02 use per patient  No blood thinners per patient  Pt denies issues with constipation  No A fib or A flutter  EMMI video sent to pt's e mail -pt declined Pt was given a suprep at her OV in [place]- she has this at home so instructed her on this prep today

## 2017-06-14 ENCOUNTER — Encounter: Payer: Self-pay | Admitting: Internal Medicine

## 2017-06-22 ENCOUNTER — Ambulatory Visit (AMBULATORY_SURGERY_CENTER): Payer: Federal, State, Local not specified - PPO | Admitting: Internal Medicine

## 2017-06-22 ENCOUNTER — Encounter: Payer: Self-pay | Admitting: Internal Medicine

## 2017-06-22 VITALS — BP 95/44 | HR 71 | Temp 98.0°F | Resp 11 | Ht 65.5 in | Wt 183.0 lb

## 2017-06-22 DIAGNOSIS — K589 Irritable bowel syndrome without diarrhea: Secondary | ICD-10-CM | POA: Insufficient documentation

## 2017-06-22 DIAGNOSIS — K582 Mixed irritable bowel syndrome: Secondary | ICD-10-CM

## 2017-06-22 DIAGNOSIS — R194 Change in bowel habit: Secondary | ICD-10-CM | POA: Diagnosis not present

## 2017-06-22 DIAGNOSIS — R109 Unspecified abdominal pain: Secondary | ICD-10-CM | POA: Diagnosis not present

## 2017-06-22 HISTORY — DX: Irritable bowel syndrome, unspecified: K58.9

## 2017-06-22 MED ORDER — SODIUM CHLORIDE 0.9 % IV SOLN: 500.0000 mL | INTRAVENOUS | Status: DC

## 2017-06-22 NOTE — Patient Instructions (Addendum)
The colon and end of the small intestine look ok. No inflammation, no Crohn's disease or ulcerative colitis.  Based upon this and your overall history I think you have IBS or Irritable Bowel Syndrome.  Please take IB Delene Ruffini (we will give samples and/or coupon) 2 before breakfast and 2 before supper.  Also want you to take VSL # 3 probiotic daily x 1 month  Also take 1 tablespoon of Benefiber daily.  Please call now and schedule a follow-up with me for November - call me back sooner if not improving.  I appreciate the opportunity to care for you. Iva Boop, MD, FACG  YOU HAD AN ENDOSCOPIC PROCEDURE TODAY AT THE [facility]:   Refer to the procedure report that was given to you for any specific questions about what was found during the examination.  If the procedure report does not answer your questions, please call your gastroenterologist to clarify.  If you requested that your care partner not be given the details of your procedure findings, then the procedure report has been included in a sealed envelope for you to review at your convenience later.  YOU SHOULD EXPECT: Some feelings of bloating in the abdomen. Passage of more gas than usual.  Walking can help get rid of the air that was put into your GI tract during the procedure and reduce the bloating. If you had a lower endoscopy (such as a colonoscopy or flexible sigmoidoscopy) you may notice spotting of blood in your stool or on the toilet paper. If you underwent a bowel prep for your procedure, you may not have a normal bowel movement for a few days.  Please Note:  You might notice some irritation and congestion in your nose or some drainage.  This is from the oxygen used during your procedure.  There is no need for concern and it should clear up in a day or so.  SYMPTOMS TO REPORT IMMEDIATELY:   Following lower endoscopy (colonoscopy or flexible sigmoidoscopy):  Excessive amounts of blood in the  stool  Significant tenderness or worsening of abdominal pains  Swelling of the abdomen that is new, acute  Fever of 100F or higher   For urgent or emergent issues, a gastroenterologist can be reached at any hour by calling [phone].   DIET:  We do recommend a small meal at first, but then you may proceed to your regular diet.  Drink plenty of fluids but you should avoid alcoholic beverages for 24 hours.  ACTIVITY:  You should plan to take it easy for the rest of today and you should NOT DRIVE or use heavy machinery until tomorrow (because of the sedation medicines used during the test).    FOLLOW UP: Our staff will call the number listed on your records the next business day following your procedure to check on you and address any questions or concerns that you may have regarding the information given to you following your procedure. If we do not reach you, we will leave a message.  However, if you are feeling well and you are not experiencing any problems, there is no need to return our call.  We will assume that you have returned to your regular daily activities without incident.  If any biopsies were taken you will be contacted by phone or by letter within the next 1-3 weeks.  Please call us at [phone] if you have not heard about the biopsies in 3 weeks.    SIGNATURES/CONFIDENTIALITY: You  and/or your care partner have signed paperwork which will be entered into your electronic medical record.  These signatures attest to the fact that that the information above on your After Visit Summary has been reviewed and is understood.  Full responsibility of the confidentiality of this discharge information lies with you and/or your care-partner.  Thank you for letting us take care of your healthcare needs today.

## 2017-06-22 NOTE — Progress Notes (Signed)
To PACU, VSS. Report to [email or web address] 

## 2017-06-22 NOTE — Op Note (Signed)
[facility] Patient Name: Courtney [name] Procedure Date: 06/22/2017 10:47 AM MRN: [id number] Endoscopist: Iva Boop , MD Age: 26 Referring MD:  Date of Birth: 1991-01-24 Gender: Female Account #: [id number] Procedure:                Colonoscopy Indications:              Change in bowel habits Medicines:                Propofol per Anesthesia, Monitored Anesthesia Care Procedure:                Pre-Anesthesia Assessment:                           - Prior to the procedure, a History and Physical                            was performed, and patient medications and                            allergies were reviewed. The patient's tolerance of                            previous anesthesia was also reviewed. The risks                            and benefits of the procedure and the sedation                            options and risks were discussed with the patient.                            All questions were answered, and informed consent                            was obtained. Prior Anticoagulants: The patient has                            taken no previous anticoagulant or antiplatelet                            agents. ASA Grade Assessment: II - A patient with                            mild systemic disease. After reviewing the risks                            and benefits, the patient was deemed in                            satisfactory condition to undergo the procedure.                           After obtaining informed consent, the colonoscope  was passed under direct vision. Throughout the                            procedure, the patient's blood pressure, pulse, and                            oxygen saturations were monitored continuously. The                            Colonoscope was introduced through the anus and                            advanced to the the terminal ileum, with                            identification of the  appendiceal orifice and IC                            valve. The colonoscopy was performed without                            difficulty. The patient tolerated the procedure                            well. The quality of the bowel preparation was                            excellent. The terminal ileum, ileocecal valve,                            appendiceal orifice, and rectum were photographed. Scope In: 11:01:27 AM Scope Out: 11:10:16 AM Scope Withdrawal Time: 0 hours 6 minutes 18 seconds  Total Procedure Duration: 0 hours 8 minutes 49 seconds  Findings:                 The perianal and digital rectal examinations were                            normal.                           The terminal ileum appeared normal.                           The entire examined colon appeared normal on direct                            and retroflexion views. Complications:            No immediate complications. Estimated Blood Loss:     Estimated blood loss: none. Impression:               - The examined portion of the ileum was normal.                           - The entire examined colon  is normal on direct and                            retroflexion views.                           - No specimens collected. Recommendation:           - Patient has a contact number available for                            emergencies. The signs and symptoms of potential                            delayed complications were discussed with the                            patient. Return to normal activities tomorrow.                            Written discharge instructions were provided to the                            patient.                           - Resume previous diet.                           - Continue present medications.                           - Benfiber 1 tbsp daily                           IB Gard 2 bid                           VSL # 3 112.5 x 1 month                           See me in November  - f/u IBS                           - No recommendation at this time regarding repeat                            colonoscopy due to young age. Iva Boop, MD 06/22/2017 11:19:56 AM This report has been signed electronically.

## 2017-06-25 ENCOUNTER — Telehealth: Payer: Self-pay | Admitting: *Deleted

## 2017-06-25 NOTE — Telephone Encounter (Signed)
No answer for post procedure call back. SM 

## 2017-06-25 NOTE — Telephone Encounter (Signed)
  Follow up Call-  Call back number 06/22/2017  Post procedure Call Back phone  # [phone]  Permission to leave phone message Yes  Some recent data might be hidden     Patient questions:  Do you have a fever, pain , or abdominal swelling? No. Pain Score  0 *  Have you tolerated food without any problems? Yes.    Have you been able to return to your normal activities? Yes.    Do you have any questions about your discharge instructions: Diet   Yes PAtient was asking about a special diet for her IBS. Will mail out Low Fodmap diet and handouts on IBS. Medications  No. Follow up visit  No.  Do you have questions or concerns about your Care? No.  Actions: * If pain score is 4 or above: No action needed, pain <4.

## 2017-07-16 DIAGNOSIS — F411 Generalized anxiety disorder: Secondary | ICD-10-CM | POA: Diagnosis not present

## 2017-07-23 DIAGNOSIS — F411 Generalized anxiety disorder: Secondary | ICD-10-CM | POA: Diagnosis not present

## 2017-08-13 DIAGNOSIS — F411 Generalized anxiety disorder: Secondary | ICD-10-CM | POA: Diagnosis not present

## 2018-01-11 DIAGNOSIS — F411 Generalized anxiety disorder: Secondary | ICD-10-CM | POA: Diagnosis not present

## 2018-03-06 DIAGNOSIS — F411 Generalized anxiety disorder: Secondary | ICD-10-CM | POA: Diagnosis not present

## 2018-03-28 DIAGNOSIS — F411 Generalized anxiety disorder: Secondary | ICD-10-CM | POA: Diagnosis not present

## 2018-04-02 ENCOUNTER — Other Ambulatory Visit: Payer: Self-pay | Admitting: Obstetrics and Gynecology

## 2018-04-02 ENCOUNTER — Other Ambulatory Visit (HOSPITAL_COMMUNITY)
Admission: RE | Admit: 2018-04-02 | Discharge: 2018-04-02 | Disposition: A | Discharge status: Home / Self Care | Payer: Federal, State, Local not specified - PPO | Source: Ambulatory Visit | Attending: Obstetrics and Gynecology | Admitting: Obstetrics and Gynecology

## 2018-04-02 DIAGNOSIS — Z23 Encounter for immunization: Secondary | ICD-10-CM | POA: Diagnosis not present

## 2018-04-02 DIAGNOSIS — L83 Acanthosis nigricans: Secondary | ICD-10-CM | POA: Diagnosis not present

## 2018-04-02 DIAGNOSIS — Z01411 Encounter for gynecological examination (general) (routine) with abnormal findings: Secondary | ICD-10-CM | POA: Diagnosis not present

## 2018-04-02 DIAGNOSIS — Z01419 Encounter for gynecological examination (general) (routine) without abnormal findings: Secondary | ICD-10-CM | POA: Insufficient documentation

## 2018-04-02 DIAGNOSIS — Z13220 Encounter for screening for lipoid disorders: Secondary | ICD-10-CM | POA: Diagnosis not present

## 2018-04-03 LAB — CYTOLOGY - PAP: Diagnosis: NEGATIVE

## 2018-04-10 DIAGNOSIS — Z975 Presence of (intrauterine) contraceptive device: Secondary | ICD-10-CM | POA: Diagnosis not present

## 2018-09-17 DIAGNOSIS — F419 Anxiety disorder, unspecified: Secondary | ICD-10-CM | POA: Diagnosis not present

## 2018-09-17 DIAGNOSIS — Z0000 Encounter for general adult medical examination without abnormal findings: Secondary | ICD-10-CM | POA: Diagnosis not present

## 2018-09-17 DIAGNOSIS — Z13220 Encounter for screening for lipoid disorders: Secondary | ICD-10-CM | POA: Diagnosis not present

## 2018-09-17 DIAGNOSIS — H9190 Unspecified hearing loss, unspecified ear: Secondary | ICD-10-CM | POA: Diagnosis not present

## 2018-10-28 DIAGNOSIS — J209 Acute bronchitis, unspecified: Secondary | ICD-10-CM | POA: Diagnosis not present

## 2018-10-31 DIAGNOSIS — H93293 Other abnormal auditory perceptions, bilateral: Secondary | ICD-10-CM | POA: Diagnosis not present

## 2019-01-01 DIAGNOSIS — F411 Generalized anxiety disorder: Secondary | ICD-10-CM | POA: Diagnosis not present

## 2019-02-20 DIAGNOSIS — F411 Generalized anxiety disorder: Secondary | ICD-10-CM | POA: Diagnosis not present

## 2019-03-01 DIAGNOSIS — S60459A Superficial foreign body of unspecified finger, initial encounter: Secondary | ICD-10-CM | POA: Diagnosis not present

## 2020-02-13 ENCOUNTER — Ambulatory Visit: Payer: Self-pay | Admitting: Audiologist

## 2020-03-03 ENCOUNTER — Other Ambulatory Visit: Payer: Self-pay

## 2020-03-03 ENCOUNTER — Ambulatory Visit: Payer: 59 | Attending: Physician Assistant | Admitting: Audiologist

## 2020-03-03 DIAGNOSIS — H9325 Central auditory processing disorder: Secondary | ICD-10-CM

## 2020-03-03 NOTE — Procedures (Signed)
Outpatient Audiology and [facility] [address] [place], [place]  [address] [phone]  Report of Auditory Processing Evaluation     Patient: Courtney [name]  Date of Birth: [birth year]  Date of Evaluation: 03/03/20  Audiologist: Ammie Ferrier, AuD   Courtney [name], 29 y.o. years old, was seen for a central auditory evaluation upon referral of Courtney [name] in order to clarify auditory skills and provide recommendations as needed.   HISTORY        Courtney [name] has difficulty hearing in most environments. She has a hard time hearing people over the phone, when they are not facing her, when there is other noise and at work. Courtney [name] has a history of ear infections as a child. Her mother told her she had many. Courtney [name] does not recall having myringotomy tubes. She has a relevant history of concussions as well. Her last head injury was in 2013. As of today, Courtney [name] has been diagnosed with ADHD. She will be starting medication soon. Over all she feels she really has to concentrate to hear, but her hearing tests in the past have been normal.     EVALUATION   Central auditory (re)evaluation consists of standard puretone and speech audiometry and tests that "overwork" the auditory system to assess auditory integrity. Patients recognize signals altered or distorted through electronic filtering, are presented in competition with a speech or noise signal, or are presented in a series. Scores > 2 SDs below the mean for age are abnormal. Specific central auditory processing disorder is defined as poor scores on sets of tests taxing similar skills. Results provide information regarding integrity of central auditory processes including binaural processing, auditory discrimination, and temporal processing. Tests and results are given below.  Test-Taking Behaviors:    Courtney [name] participated in all tasks during session and results are considered a reliable estimate of auditory skills at this time.   Peripheral auditory testing  results :   Puretone audiometric testing revealed normal hearing in both ears from 250-8,0000 Hz. Speech Reception Thresholds were 10 dB in the left ear and 5 dB in the right ear. Word recognition was 100 % for the right ear and 100 % for the left ear. NU-6 words were presented 45 dB SL re: STs. Immittance testing yielded type A normally shaped tympanograms for each ear       central auditory processing test explanations and results  Test Explanation and Performance:  A test score > 2 SDs below the mean for age is indicated as 'below' and is considered statistically significant. A normal test score is indicated as 'above'.    . Low Pass Filtered Speech (LPFS) Test: [name] repeated the words filtered to remove or reduce high frequency cues. Taxes auditory closure and discrimination.  Courtney [name] performed below for the right ear and below for the left ear.  o Courtney [name] scored 76% on the right ear and 76% on the left ear. The age matched norm is 78% on the right ear and 78% on the left ear.   . Time-Compressed Speech (TCR) Test: Courtney [name] repeated words altered through reduction of duration (45% time-compression) plus addition of 0.3 seconds reverberation. Taxes auditory closure and discrimination. Courtney [name] performed below for the right ear and below  for the left ear.  o Courtney [name] scored 68% on the right ear and 64% on the left ear. The age matched norm is 73% on the right ear and 73% on the left ear.   . Competing Sentences Test (CST): Courtney [name] repeated one of  two sentences presented simultaneously, one to each ear, e.g. report right ear only, report left ear only. Taxes binaural separation skills. Courtney [name] performed above for the right ear and above  for the left ear.   o Courtney [name] scored 100% on the right ear and 95% on the left ear. The age matched norm is 90% on the right ear and 90% on the left ear.   . Dichotic Digits (DD) Test: Courtney [name] repeated four digits (1-10, excluding 7) presented simultaneously, two to each ear. Less linguistically  loaded than other dichotic measures, taxes binaural integration. Courtney [name] performed above for the right ear and above  for the left ear.  o Courtney [name] scored 100% on the right ear and 95% on the left ear. The age matched norm is 90% on the right ear and 90% on the left ear.   . Staggered Spondaic Word (SSW) Test: Courtney [name] repeats two compound words, presented one to each ear and aligned such that second syllable of first spondee overlaps in time with first syllable of second spondee, e.g., RE - upstairs, LE - downtown, overlapping syllables - stairs and down. Taxes binaural integration and organization skills. Courtney [name] performed above for the right ear and above  for the left ear.   o Courtney [name] stands for right and left non competing stimulus (only one word in one ear) while RC and LC stands for right and left competing (one word in both ears at the same time).  Courtney [name] had Courtney 0 errors, RC 0 errors, LC 0 errors and [name] 0 errors. Allowed errors for age matched peer is Courtney 1 errors, RC 2 errors, LC 4 errors and [name] 1 errors.  Courtney [name] Patterns Sequence (PPS) Test: (Musiek scoring): Courtney [name] labeled and/or imitated three-tone sequences composed of high (H) and low (L) tones, e.g., LHL, HHL, LLH, etc. Taxes pitch discrimination, pattern recognition, binaural integration, sequencing and organization. Courtney [name] performed above for both ears.  o Courtney [name] scored 100% in both ears. The age matched norm is 80% for both ears.    Testing Results:   1) Adequate hearing sensitivity and middle ear function for each ear.    2) Poor performance on degraded speech tasks (LPFS, TCR, speech in noise) taxing auditory discrimination and closure   3) Adequate performance across dichotic listening tasks taxing binaural integration (DD, SSW) and separation (CST).   4) Adequate performance labelling tonal patterns (PPS) taxing tempora processing  Diagnosis  1) Adequate peripheral auditory function for each ear  2) Deficit in auditory discrimination   3) Adequate binaural integration-separation (i.e., auditory-language association deficit) a. Diagnosis of Decoding Auditory Processing Disorder.  b. This diagnosis is characterized by the following:    Decoding Deficit: Auditory decoding is the process of distinguishing the difference between the acoustic contours of speech sounds. Speech sounds are rapid, and the inflection that differentiates them can be very small. A decoding auditory processing disorder makes distinguished these sounds inefficient so words become confused or missed. A decoding deficit in processing creates difficulty differentiating between different speech sounds that are similar.  When a person cannot process which speech sound they hear, it leads to the person mishearing what is said or missing words all together. Some examples of how these words are confused are "ship" becomes "sip" or "pitch" becomes "ditch" or "wash" becomes "watch". This can also lead to someone not hearing the ends of sentences or directions, as they are still trying to distinguish what was said at the beginning of the phrase.  Additional competing information, like background noise or other talkers, creates additional barriers to understanding what is said as it masks out part of the word. This is often attributed to ear infections during the time if language acquisition. When a person has chronic ear infections sounds are muffled and unclear, everything sounds 'under water'. The person then has a deficit in their ability to tell the difference between the speech sounds, as everything they heard was muffled and unclear. Someone with a decoding deficit needs ample and consistent context and visual cues (such as seeing the face, or written directions) to help differentiate between speech sounds and fill in the gaps when a sound is missed.    Recommendations   Holden was advised of the results. Results indicate a Decoding Deficit.  1) For intervention the  following it recommended:    Most interventions are targeted at children aged seven to twelve while these processes are still developing. One adult focussed listening in noise program is called LACE: Listening and Communication Enhancement. With diligent practice literature shows this program helps people to better understand speech in noise. For more information visit [email or web address].com   If Regnia feels further intervention is necessary, she is referred to the [facility]. This center provides targeted intervention for CAPD and can make recommendations. For more information visit their website at [email or web address]   2) The following accommodations are necessary to provide [place] with an unrestricted listening environment:     For [place]:   Sit or stand near and facing the speaker. Use visual cues to enhance comprehension.   Take listening breaks during the day to minimize auditory fatigue.   "Guess" when possible. Learn to take educated guesses when not sure of the answer.   Ask for clarification as needed.   Ask for extra time as needed to respond.  For any note-taking, use a digital voice recorder, e.g., smart pen or notetaking app.    For the Family:   Use Clear Speech (speaking at a slightly reduced rate and slightly increased loudness).    Use Clear Language. Clear Language includes: limiting use of non-specific references, avoiding ambiguous language  Only expect Marysol to understand when you are FACE TO FACE within five feet, and without any other distracting noise   Repeat when necessary to help [place] understand what is said, and avoid turning away or speaking from a distance   Always get Alivea's attention before speaking to her in order to prevent miscommunication    Please contact the audiologist, Ammie Ferrier with any questions about this report or the evaluation. Thank you for the opportunity to work with you.  Sincerely    Ammie Ferrier, AuD,  [email or web address]

## 2020-05-17 ENCOUNTER — Other Ambulatory Visit: Payer: Self-pay | Admitting: *Deleted

## 2020-05-17 ENCOUNTER — Encounter: Payer: Self-pay | Admitting: *Deleted

## 2020-05-17 DIAGNOSIS — R0600 Dyspnea, unspecified: Secondary | ICD-10-CM

## 2020-05-17 DIAGNOSIS — R0609 Other forms of dyspnea: Secondary | ICD-10-CM

## 2020-05-17 DIAGNOSIS — R000 Tachycardia, unspecified: Secondary | ICD-10-CM

## 2020-05-17 NOTE — Progress Notes (Signed)
Patient ID: Courtney [name], female   DOB: 1991/08/25, 29 y.o.   MRN: [id number] Patient enrolled for [employer] to ship a 14 day ZIO XT long term holter monitor to her home. Letter with instructions mailed to patient.

## 2020-05-19 ENCOUNTER — Ambulatory Visit (INDEPENDENT_AMBULATORY_CARE_PROVIDER_SITE_OTHER): Payer: 59

## 2020-05-19 DIAGNOSIS — R000 Tachycardia, unspecified: Secondary | ICD-10-CM | POA: Diagnosis not present

## 2020-05-19 DIAGNOSIS — R0609 Other forms of dyspnea: Secondary | ICD-10-CM

## 2020-05-19 DIAGNOSIS — R0600 Dyspnea, unspecified: Secondary | ICD-10-CM

## 2020-06-16 ENCOUNTER — Ambulatory Visit: Payer: 59 | Admitting: Cardiology

## 2020-06-29 ENCOUNTER — Ambulatory Visit (INDEPENDENT_AMBULATORY_CARE_PROVIDER_SITE_OTHER): Payer: 59 | Admitting: Internal Medicine

## 2020-06-29 ENCOUNTER — Other Ambulatory Visit: Payer: Self-pay

## 2020-06-29 ENCOUNTER — Encounter: Payer: Self-pay | Admitting: Internal Medicine

## 2020-06-29 VITALS — BP 116/64 | HR 80 | Ht 65.5 in | Wt 175.8 lb

## 2020-06-29 DIAGNOSIS — R000 Tachycardia, unspecified: Secondary | ICD-10-CM | POA: Diagnosis not present

## 2020-06-29 DIAGNOSIS — R0600 Dyspnea, unspecified: Secondary | ICD-10-CM | POA: Diagnosis not present

## 2020-06-29 DIAGNOSIS — R0609 Other forms of dyspnea: Secondary | ICD-10-CM

## 2020-06-29 DIAGNOSIS — R55 Syncope and collapse: Secondary | ICD-10-CM

## 2020-06-29 NOTE — Progress Notes (Signed)
Cardiology Office Note:    Date:  06/29/2020   ID:  Courtney [name], DOB 1990/12/17, MRN [id number]  PCP:  Milus Height, PA  Cardiologist:  Parke Poisson, MD  Electrophysiologist:  None   Referring MD: Milus Height, PA   Chief Complaint/Reason for Referral: Chest pain, DOE, palpitations  History of Present Illness:    Courtney [name] is a 29 y.o. female with no significant past cardiovascular history who presents for CP, DOE, palpitations. She has ADHD and takes Vyvance.  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 Used to exercise a lot and was in great shape, does not think it was from deconditioning. Played in college - [employer]. Positional presyncope, one syncopal episode after standing. Still has significant presyncope.    Does martial arts and notices symptoms even when squatting.   Continues to get chest pain with activity. Substernal, nonradiating. Works for [employer].   No history of asthma.   Past Medical History:  Diagnosis Date  . Allergy   . Anemia    no current med.  . 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 Complication of anesthesia    states was told she "slept longer in recovery than most people"  . Heart murmur    pt states mom told her she has heart murmur  . Hives    on and off  . IBS (irritable bowel syndrome) 06/22/2017  . Irritable bowel syndrome with constipation    alternates constipation with diarrhea  . Osteoarthritis of right knee 07/2015  . 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 KNEE ARTHROSCOPY WITH LATERAL RELEASE Right 07/29/2015   Procedure: RIGHT KNEE ARTHROSCOPY CHONDROPLASTY WITH LATERAL RELEASE;  Surgeon: Loreta Ave, MD;  Location: [facility];  Service: Orthopedics;  Laterality: Right;  . LAPAROSCOPY N/A 04/30/2014   Procedure: LAPAROSCOPY DIAGNOSTIC;  Surgeon: Geryl Rankins, MD;  Location: [facility];  Service: Gynecology;  Laterality: N/A;  . SYNOVECTOMY Right 07/29/2015   Procedure: LIMITED SYNOVECTOMY PLICA ;  Surgeon: Loreta Ave, MD;  Location:  [facility];  Service: Orthopedics;  Laterality: Right;    Current Medications: Current Meds  Medication Sig  . levonorgestrel (MIRENA) 20 MCG/24HR IUD 1 each by Intrauterine route once.  . Vitamin D, Ergocalciferol, (DRISDOL) 1.25 MG (50000 UNIT) CAPS capsule Take 50,000 Units by mouth once a week.  Marland Kitchen VYVANSE 40 MG capsule Take 40 mg by mouth every morning.     Allergies:   Patient has no known allergies.   Social History   Tobacco Use  . Smoking status: Never Smoker  . Smokeless tobacco: Never Used  Substance Use Topics  . Alcohol use: No  . Drug use: No     Family History: The patient's family history is negative for Colon cancer, Colon polyps, Rectal cancer, and Stomach cancer.  ROS:   Please see the history of present illness.    All other systems reviewed and are negative.  EKGs/Labs/Other Studies Reviewed:    The following studies were reviewed today:  EKG:  NSR, nonspecific t wave abnormality  Recent Labs: No results found for requested labs within last 8760 hours.  Recent Lipid Panel No results found for: CHOL, TRIG, HDL, CHOLHDL, VLDL, LDLCALC, LDLDIRECT  Physical Exam:    VS:  BP 116/64   Pulse 80   Ht  5' 5.5" (1.664 m)   Wt 175 lb 12.8 oz (79.7 kg)   SpO2 98%   BMI 28.81 kg/m     Wt Readings from Last 5 Encounters:  06/29/20 175 lb 12.8 oz (79.7 kg)  06/22/17 183 lb (83 kg)  06/08/17 183 lb 12.8 oz (83.4 kg)  01/10/17 186 lb (84.4 kg)  12/14/16 182 lb (82.6 kg)    Constitutional: No acute distress Eyes: sclera non-icteric, normal conjunctiva and lids ENMT: normal dentition, moist mucous membranes Cardiovascular: regular rhythm, normal rate, soft systolic murmur. S1 and S2 normal. Radial pulses normal bilaterally. No jugular venous distention.    Respiratory: clear to auscultation bilaterally GI : normal bowel sounds, soft and nontender. No distention.   MSK: extremities warm, well perfused. No edema.  NEURO: grossly nonfocal exam, moves all extremities. PSYCH: alert and oriented x 3, normal mood and affect.   ASSESSMENT:    1. Tachycardia, unspecified   2. Syncope and collapse   3. Dyspnea on exertion    PLAN:    Tachycardia, unspecified - Plan: EKG 12-Lead No worrisome findings on recent cardiac monitor.  She had 20% burden of sinus tachycardia, we discussed that this is benign.  We discussed conservative measures including compression stockings, liberalizing salt, and adequate hydration.  Syncope and collapse Dyspnea on exertion -During Valsalva maneuver in the office today she had an episode of presyncope.  She describes longstanding positional dizziness and lightheadedness with an episode of syncope, and longstanding dyspnea on exertion with chest pain.  I would like to exclude HCM with obstruction, will obtain an echocardiogram.   Weston Brass, MD [place]  [facility]    Medication Adjustments/Labs and Tests Ordered: Current medicines are reviewed at length with the patient today.  Concerns regarding medicines are outlined above.   Orders Placed This Encounter  Procedures  . EKG 12-Lead  . ECHOCARDIOGRAM COMPLETE    No orders of the defined types were placed in this encounter.   Patient Instructions  Medication Instructions:  No Changes In Medications at this time.  *If you need a refill on your cardiac medications before your next appointment, please call your pharmacy*  Lab Work: None Ordered At This Time.  If you have labs (blood work) drawn today and your tests are completely normal, you will receive your results only by: Marland Kitchen MyChart Message (if you have MyChart) OR . A paper copy in the mail If you have any lab test that is abnormal or we need to change your treatment, we will call you to  review the results.  Testing/Procedures: Your physician has requested that you have an echocardiogram. Echocardiography is a painless test that uses sound waves to create images of your heart. It provides your doctor with information about the size and shape of your heart and how well your heart's chambers and valves are working. You may receive an ultrasound enhancing agent through an IV if needed to better visualize your heart during the echo.This procedure takes approximately one hour. There are no restrictions for this procedure. This will take place at the [address], [address].   Follow-Up: At [facility], you and your health needs are our priority.  As part of our continuing mission to provide you with exceptional heart care, we have created designated Provider Care Teams.  These Care Teams include your primary Cardiologist (physician) and Advanced Practice Providers (APPs -  Physician Assistants and Nurse Practitioners) who all work together to provide you with the  care you need, when you need it.  We recommend signing up for the patient portal called "MyChart".  Sign up information is provided on this After Visit Summary.  MyChart is used to connect with patients for Virtual Visits (Telemedicine).  Patients are able to view lab/test results, encounter notes, upcoming appointments, etc.  Non-urgent messages can be sent to your provider as well.   To learn more about what you can do with MyChart, go to [email or web address].    Your next appointment:   1 month(s) to follow up Echo  The format for your next appointment:   In Person  Provider:   Weston Brass, MD

## 2020-06-29 NOTE — Patient Instructions (Signed)
Medication Instructions:  No Changes In Medications at this time.  *If you need a refill on your cardiac medications before your next appointment, please call your pharmacy*  Lab Work: None Ordered At This Time.  If you have labs (blood work) drawn today and your tests are completely normal, you will receive your results only by: Marland Kitchen MyChart Message (if you have MyChart) OR . A paper copy in the mail If you have any lab test that is abnormal or we need to change your treatment, we will call you to review the results.  Testing/Procedures: Your physician has requested that you have an echocardiogram. Echocardiography is a painless test that uses sound waves to create images of your heart. It provides your doctor with information about the size and shape of your heart and how well your heart's chambers and valves are working. You may receive an ultrasound enhancing agent through an IV if needed to better visualize your heart during the echo.This procedure takes approximately one hour. There are no restrictions for this procedure. This will take place at the [address].   Follow-Up: At [facility], you and your health needs are our priority.  As part of our continuing mission to provide you with exceptional heart care, we have created designated Provider Care Teams.  These Care Teams include your primary Cardiologist (physician) and Advanced Practice Providers (APPs -  Physician Assistants and Nurse Practitioners) who all work together to provide you with the care you need, when you need it.  We recommend signing up for the patient portal called "MyChart".  Sign up information is provided on this After Visit Summary.  MyChart is used to connect with patients for Virtual Visits (Telemedicine).  Patients are able to view lab/test results, encounter notes, upcoming appointments, etc.  Non-urgent messages can be sent to your provider as well.   To learn more about what you can do with  MyChart, go to [email or web address].    Your next appointment:   1 month(s) to follow up Echo  The format for your next appointment:   In Person  Provider:   Weston Brass, MD

## 2020-07-12 ENCOUNTER — Other Ambulatory Visit: Payer: Self-pay

## 2020-07-12 ENCOUNTER — Ambulatory Visit (HOSPITAL_COMMUNITY): Payer: 59 | Attending: Cardiology

## 2020-07-12 DIAGNOSIS — R55 Syncope and collapse: Secondary | ICD-10-CM | POA: Diagnosis not present

## 2020-07-12 DIAGNOSIS — R000 Tachycardia, unspecified: Secondary | ICD-10-CM | POA: Insufficient documentation

## 2020-07-12 DIAGNOSIS — R0600 Dyspnea, unspecified: Secondary | ICD-10-CM

## 2020-07-12 DIAGNOSIS — R0609 Other forms of dyspnea: Secondary | ICD-10-CM

## 2020-07-12 LAB — ECHOCARDIOGRAM COMPLETE
Area-P 1/2: 3.58 cm2
S' Lateral: 2.8 cm

## 2020-08-18 ENCOUNTER — Other Ambulatory Visit: Payer: Self-pay

## 2020-08-18 ENCOUNTER — Encounter: Payer: Self-pay | Admitting: Internal Medicine

## 2020-08-18 ENCOUNTER — Ambulatory Visit: Payer: 59 | Admitting: Internal Medicine

## 2020-08-18 VITALS — BP 120/72 | HR 82 | Ht 65.5 in | Wt 168.4 lb

## 2020-08-18 DIAGNOSIS — R0609 Other forms of dyspnea: Secondary | ICD-10-CM

## 2020-08-18 DIAGNOSIS — R000 Tachycardia, unspecified: Secondary | ICD-10-CM | POA: Diagnosis not present

## 2020-08-18 DIAGNOSIS — R55 Syncope and collapse: Secondary | ICD-10-CM

## 2020-08-18 DIAGNOSIS — R0600 Dyspnea, unspecified: Secondary | ICD-10-CM | POA: Diagnosis not present

## 2020-08-18 NOTE — Progress Notes (Signed)
Cardiology Office Note:    Date:  08/18/2020   ID:  Courtney [name], DOB November 22, 1990, MRN [id number]  PCP:  Milus Height, PA  Cardiologist:  Parke Poisson, MD  Electrophysiologist:  None   Referring MD: Milus Height, PA   Chief Complaint/Reason for Referral: Follow up palpitations, dizziness  History of Present Illness:    Courtney [name] is a 29 y.o. female with a history of cardiovascular history who presents for CP, DOE, palpitations. She has ADHD and takes Vyvanse.   We reviewed the results of her cardiac monitor which showed sinus rhythm and sinus tachycardia associated with her symptoms.  Frequent symptoms documented, no worrisome arrhythmia or ectopy.  She overall feels things are slightly better from a cardiac standpoint and also feels they are mostly related to anxiety.  She does currently have bronchitis and tested negative for Covid per her report.  We discussed sinus tachycardia that can be symptomatic and discussed conservative measures including compression stockings for dizziness with a structurally normal echocardiogram.  I have offered her metoprolol for symptoms, she defers at this time and would like to observe her symptoms which I think is reasonable.  We discussed that stimulant use for ADHD can contribute to sinus tachycardia, however she feels so much better from an attention standpoint that she is not ready to stop this medication.  Past Medical History:  Diagnosis Date  . Allergy   . Anemia    no current med.  . 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 Complication of anesthesia    states was told she "slept longer in recovery than most people"  . Heart murmur    pt states mom told her she has heart murmur  . Hives    on and off  . IBS (irritable bowel syndrome) 06/22/2017  . Irritable bowel syndrome with constipation    alternates constipation with diarrhea  . Osteoarthritis of right knee 07/2015  . 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 KNEE ARTHROSCOPY WITH LATERAL RELEASE Right 07/29/2015   Procedure: RIGHT KNEE ARTHROSCOPY CHONDROPLASTY WITH LATERAL RELEASE;  Surgeon: Loreta Ave, MD;  Location: [facility];  Service: Orthopedics;  Laterality: Right;  . LAPAROSCOPY N/A 04/30/2014   Procedure: LAPAROSCOPY DIAGNOSTIC;  Surgeon: Geryl Rankins, MD;  Location: [facility];  Service: Gynecology;  Laterality: N/A;  . SYNOVECTOMY Right 07/29/2015   Procedure: LIMITED SYNOVECTOMY PLICA ;  Surgeon: Loreta Ave, MD;  Location: [facility];  Service: Orthopedics;  Laterality: Right;    Current Medications: Current Meds  Medication Sig  . levonorgestrel (MIRENA) 20 MCG/24HR IUD 1 each by Intrauterine route once.  . Vitamin D, Ergocalciferol, (DRISDOL) 1.25 MG (50000 UNIT) CAPS capsule Take 50,000 Units by mouth once a week.  Marland Kitchen VYVANSE 50 MG capsule Take 50 mg by mouth daily.     Allergies:   Patient has no known allergies.   Social History   Tobacco Use  . Smoking status: Never Smoker  . Smokeless tobacco: Never Used  Substance Use Topics  . Alcohol use: No  . Drug use: No     Family History: The patient's family history is negative for Colon cancer, Colon polyps, Rectal cancer, and Stomach cancer.  ROS:   Please see the history of present illness.    All other systems reviewed and are negative.  EKGs/Labs/Other Studies Reviewed:    The following studies were reviewed  today: Recent Labs: No results found for requested labs within last 8760 hours.  Recent Lipid Panel No results found for: CHOL, TRIG, HDL, CHOLHDL, VLDL, LDLCALC, LDLDIRECT  Physical Exam:    VS:  BP 120/72   Pulse 82   Ht 5' 5.5" (1.664 m)   Wt 168 lb 6.4 oz (76.4 kg)   SpO2 99%   BMI 27.60 kg/m     Wt Readings from Last 5 Encounters:  08/18/20 168 lb 6.4 oz (76.4 kg)  06/29/20 175 lb 12.8 oz (79.7 kg)  06/22/17 183 lb (83 kg)  06/08/17 183 lb  12.8 oz (83.4 kg)  01/10/17 186 lb (84.4 kg)    Constitutional: No acute distress Eyes: sclera non-icteric, normal conjunctiva and lids ENMT: normal dentition, moist mucous membranes Cardiovascular: regular rhythm, normal rate, no murmurs. S1 and S2 normal. Radial pulses normal bilaterally. No jugular venous distention.  Respiratory: clear to auscultation bilaterally GI : normal bowel sounds, soft and nontender. No distention.   MSK: extremities warm, well perfused. No edema.  NEURO: grossly nonfocal exam, moves all extremities. PSYCH: alert and oriented x 3, normal mood and affect.   ASSESSMENT:    1. Tachycardia, unspecified   2. Dyspnea on exertion   3. Syncope and collapse    PLAN:    Reviewed symptoms, and monitor as well as echo as detailed above.  Discussed medication therapy and remembering to take medications as scheduled.  No structural heart disease and no worrisome findings on testing.  We can continue to observe.  If she has concerning symptoms, would consider further evaluation with treadmill stress ECG or stress echocardiogram.  Follow-up in 1 year or sooner if needed.  Total time of encounter: 30 minutes total time of encounter, including 20 minutes spent in face-to-face patient care on the date of this encounter. This time includes coordination of care and counseling regarding above mentioned problem list. Remainder of non-face-to-face time involved reviewing chart documents/testing relevant to the patient encounter and documentation in the medical record. I have independently reviewed documentation from referring provider.   Weston Brass, MD [place]  [facility]    Medication Adjustments/Labs and Tests Ordered: Current medicines are reviewed at length with the patient today.  Concerns regarding medicines are outlined above.   No orders of the defined types were placed in this encounter.   No orders of the defined types were placed in this  encounter.   Patient Instructions  Medication Instructions:  No Changes In Medications at this time.  *If you need a refill on your cardiac medications before your next appointment, please call your pharmacy*  Follow-Up: At [facility], you and your health needs are our priority.  As part of our continuing mission to provide you with exceptional heart care, we have created designated Provider Care Teams.  These Care Teams include your primary Cardiologist (physician) and Advanced Practice Providers (APPs -  Physician Assistants and Nurse Practitioners) who all work together to provide you with the care you need, when you need it.  We recommend signing up for the patient portal called "MyChart".  Sign up information is provided on this After Visit Summary.  MyChart is used to connect with patients for Virtual Visits (Telemedicine).  Patients are able to view lab/test results, encounter notes, upcoming appointments, etc.  Non-urgent messages can be sent to your provider as well.   To learn more about what you can do with MyChart, go to [email or web address].    Your next appointment:  1 year(s)  The format for your next appointment:   In Person  Provider:   Weston Brass, MD

## 2020-08-18 NOTE — Patient Instructions (Signed)
Medication Instructions:  No Changes In Medications at this time.  *If you need a refill on your cardiac medications before your next appointment, please call your pharmacy*  Follow-Up: At [facility], you and your health needs are our priority.  As part of our continuing mission to provide you with exceptional heart care, we have created designated Provider Care Teams.  These Care Teams include your primary Cardiologist (physician) and Advanced Practice Providers (APPs -  Physician Assistants and Nurse Practitioners) who all work together to provide you with the care you need, when you need it.  We recommend signing up for the patient portal called "MyChart".  Sign up information is provided on this After Visit Summary.  MyChart is used to connect with patients for Virtual Visits (Telemedicine).  Patients are able to view lab/test results, encounter notes, upcoming appointments, etc.  Non-urgent messages can be sent to your provider as well.   To learn more about what you can do with MyChart, go to [email or web address].    Your next appointment:   1 year(s)  The format for your next appointment:   In Person  Provider:   Gayatri Acharya, MD 

## 2021-03-03 ENCOUNTER — Emergency Department (HOSPITAL_BASED_OUTPATIENT_CLINIC_OR_DEPARTMENT_OTHER)
Admission: EM | Admit: 2021-03-03 | Discharge: 2021-03-03 | Disposition: A | Discharge status: Home / Self Care | Payer: 59 | Attending: Emergency Medicine | Admitting: Emergency Medicine

## 2021-03-03 ENCOUNTER — Encounter (HOSPITAL_BASED_OUTPATIENT_CLINIC_OR_DEPARTMENT_OTHER): Payer: Self-pay | Admitting: *Deleted

## 2021-03-03 ENCOUNTER — Emergency Department (HOSPITAL_BASED_OUTPATIENT_CLINIC_OR_DEPARTMENT_OTHER): Payer: 59

## 2021-03-03 ENCOUNTER — Other Ambulatory Visit: Payer: Self-pay

## 2021-03-03 DIAGNOSIS — Y9375 Activity, martial arts: Secondary | ICD-10-CM | POA: Insufficient documentation

## 2021-03-03 DIAGNOSIS — S4992XA Unspecified injury of left shoulder and upper arm, initial encounter: Secondary | ICD-10-CM | POA: Insufficient documentation

## 2021-03-03 DIAGNOSIS — X58XXXA Exposure to other specified factors, initial encounter: Secondary | ICD-10-CM | POA: Insufficient documentation

## 2021-03-03 DIAGNOSIS — S43102A Unspecified dislocation of left acromioclavicular joint, initial encounter: Secondary | ICD-10-CM

## 2021-03-03 MED ORDER — IBUPROFEN 800 MG PO TABS
800.0000 mg | ORAL_TABLET | Freq: Once | ORAL | Status: AC
  Administered 2021-03-03: 800 mg via ORAL
  Filled 2021-03-03: qty 1

## 2021-03-03 MED ORDER — DICLOFENAC SODIUM ER 100 MG PO TB24: 100.0000 mg | ORAL_TABLET | Freq: Every day | ORAL | 0 refills | Status: DC

## 2021-03-03 MED ORDER — ACETAMINOPHEN 500 MG PO TABS
1000.0000 mg | ORAL_TABLET | Freq: Once | ORAL | Status: AC
  Administered 2021-03-03: 1000 mg via ORAL
  Filled 2021-03-03: qty 2

## 2021-03-03 MED ORDER — LIDOCAINE 5 % EX PTCH: 1.0000 | MEDICATED_PATCH | CUTANEOUS | 0 refills | Status: DC

## 2021-03-03 MED ORDER — LIDOCAINE 5 % EX PTCH
1.0000 | MEDICATED_PATCH | CUTANEOUS | Status: DC
  Administered 2021-03-03: 1 via TRANSDERMAL
  Filled 2021-03-03: qty 1

## 2021-03-03 NOTE — ED Triage Notes (Signed)
C/o shoulder injury x 2 hrs ago

## 2021-03-03 NOTE — ED Provider Notes (Signed)
[facility] EMERGENCY DEPARTMENT Provider Note   CSN: [id number] Arrival date & time: 03/03/21  2221     History Chief Complaint  Patient presents with  . Shoulder Injury    Courtney [name] is a 30 y.o. female.  The history is provided by the patient.  Shoulder Injury This is a new problem. The current episode started 1 to 2 hours ago. The problem occurs constantly. The problem has not changed since onset.Pertinent negatives include no chest pain, no abdominal pain, no headaches and no shortness of breath. Nothing aggravates the symptoms. Nothing relieves the symptoms. She has tried nothing for the symptoms. The treatment provided no relief.  Doing karate and then had left shoulder pain.        Past Medical History:  Diagnosis Date  . Allergy   . Anemia    no current med.  . 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 Complication of anesthesia    states was told she "slept longer in recovery than most people"  . Heart murmur    pt states mom told her she has heart murmur  . Hives    on and off  . IBS (irritable bowel syndrome) 06/22/2017  . Irritable bowel syndrome with constipation    alternates constipation with diarrhea  . Osteoarthritis of right knee 07/2015  . 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 IBS (irritable bowel syndrome) 06/22/2017    Past Surgical History:  Procedure Laterality Date  . KNEE ARTHROSCOPY WITH LATERAL RELEASE Right 07/29/2015   Procedure: RIGHT KNEE ARTHROSCOPY CHONDROPLASTY WITH LATERAL RELEASE;  Surgeon: Loreta Ave, MD;  Location: [facility];  Service: Orthopedics;  Laterality: Right;  . LAPAROSCOPY N/A 04/30/2014   Procedure: LAPAROSCOPY DIAGNOSTIC;  Surgeon: Geryl Rankins, MD;  Location: [facility];  Service: Gynecology;  Laterality: N/A;  . SYNOVECTOMY Right 07/29/2015   Procedure: LIMITED SYNOVECTOMY PLICA ;  Surgeon: Loreta Ave, MD;  Location:  [facility];  Service: Orthopedics;  Laterality: Right;     OB History   No obstetric history on file.     Family History  Problem Relation Age of Onset  . Colon cancer Neg Hx   . Colon polyps Neg Hx   . Rectal cancer Neg Hx   . Stomach cancer Neg Hx     Social History   Tobacco Use  . Smoking status: Never Smoker  . Smokeless tobacco: Never Used  Substance Use Topics  . Alcohol use: No  . Drug use: No    Home Medications Prior to Admission medications   Medication Sig Start Date End Date Taking? Authorizing Provider  levonorgestrel (MIRENA) 20 MCG/24HR IUD 1 each by Intrauterine route once.    [provider]  Vitamin D, Ergocalciferol, (DRISDOL) 1.25 MG (50000 UNIT) CAPS capsule Take 50,000 Units by mouth once a week. 05/25/20   [provider]  VYVANSE 50 MG capsule Take 50 mg by mouth daily. 08/02/20   [provider]    Allergies    Patient has no known allergies.  Review of Systems   Review of Systems  Constitutional: Negative for fever.  HENT: Negative for congestion.   Eyes: Negative for visual disturbance.  Respiratory: Negative for shortness of breath.   Cardiovascular: Negative for chest pain.  Gastrointestinal: Negative for abdominal pain.  Genitourinary: Negative for difficulty urinating.  Musculoskeletal: Positive for arthralgias.  Skin: Negative  for rash.  Neurological: Negative for headaches.  Psychiatric/Behavioral: Negative for agitation.  All other systems reviewed and are negative.   Physical Exam Updated Vital Signs BP 123/88 (BP Location: Right Arm)   Pulse 77   Temp 98.7 F (37.1 C) (Oral)   Resp 16   Ht 5\' 5"  (1.651 m)   Wt 69.4 kg   SpO2 100%   BMI 25.46 kg/m   Physical Exam Vitals and nursing note reviewed.  Constitutional:      General: She is not in acute distress.    Appearance: Normal appearance.  HENT:     Head: Normocephalic and atraumatic.     Nose: Nose normal.  Eyes:      Conjunctiva/sclera: Conjunctivae normal.     Pupils: Pupils are equal, round, and reactive to light.  Cardiovascular:     Rate and Rhythm: Normal rate and regular rhythm.     Pulses: Normal pulses.     Heart sounds: Normal heart sounds.  Pulmonary:     Effort: Pulmonary effort is normal.     Breath sounds: Normal breath sounds.  Abdominal:     General: Abdomen is flat. Bowel sounds are normal.     Palpations: Abdomen is soft.     Tenderness: There is no abdominal tenderness. There is no guarding.  Musculoskeletal:     Right shoulder: Normal.     Left shoulder: Tenderness present. No swelling, deformity, effusion or crepitus. Normal strength. Normal pulse.     Left elbow: Normal.     Left forearm: Normal.     Left wrist: Normal. No bony tenderness.     Left hand: Normal.     Cervical back: Normal range of motion and neck supple.  Skin:    General: Skin is warm and dry.     Capillary Refill: Capillary refill takes less than 2 seconds.  Neurological:     General: No focal deficit present.     Mental Status: She is alert and oriented to person, place, and time.     Deep Tendon Reflexes: Reflexes normal.  Psychiatric:        Mood and Affect: Mood normal.        Behavior: Behavior normal.     ED Results / Procedures / Treatments   Labs (all labs ordered are listed, but only abnormal results are displayed) Labs Reviewed - No data to display  EKG None  Radiology DG Shoulder Left  Result Date: 03/03/2021 CLINICAL DATA:  Left shoulder injury EXAM: LEFT SHOULDER - 2+ VIEW COMPARISON:  None. FINDINGS: AC joint appears widened. Glenohumeral joint is intact. No fracture. Soft tissues are intact. IMPRESSION: Apparent left AC joint widening suggesting AC joint separation. Dedicated bilateral [facility] joint views with and without weights may be helpful to further evaluate. Electronically Signed   By: [facility] M.D.   On: 03/03/2021 23:12    Procedures Procedures   Medications Ordered  in ED Medications  lidocaine (LIDODERM) 5 % 1 patch (has no administration in time range)  acetaminophen (TYLENOL) tablet 1,000 mg (has no administration in time range)  ibuprofen (ADVIL) tablet 800 mg (has no administration in time range)    ED Course  I have reviewed the triage vital signs and the nursing notes.  Pertinent labs & imaging results that were available during my care of the patient were reviewed by me and considered in my medical decision making (see chart for details).  AC separation, ice no lifting and NSAIDs and follow up  with orthopedics.  Note given for work.  No sports until cleared by orthopedics.    THANVI BLINCOE was evaluated in Emergency Department on 03/03/2021 for the symptoms described in the history of present illness. She was evaluated in the context of the global COVID-19 pandemic, which necessitated consideration that the patient might be at risk for infection with the SARS-CoV-2 virus that causes COVID-19. Institutional protocols and algorithms that pertain to the evaluation of patients at risk for COVID-19 are in a state of rapid change based on information released by regulatory bodies including the CDC and federal and state organizations. These policies and algorithms were followed during the patient's care in the ED.  Final Clinical Impression(s) / ED Diagnoses Return for intractable cough, coughing up blood, fevers >100.4 unrelieved by medication, shortness of breath, intractable vomiting, chest pain, shortness of breath, weakness, numbness, changes in speech, facial asymmetry, abdominal pain, passing out, Inability to tolerate liquids or food, cough, altered mental status or any concerns. No signs of systemic illness or infection. The patient is nontoxic-appearing on exam and vital signs are within normal limits.  I have reviewed the triage vital signs and the nursing notes. Pertinent labs & imaging results that were available during my care of the patient were  reviewed by me and considered in my medical decision making (see chart for details). After history, exam, and medical workup I feel the patient has been appropriately medically screened and is safe for discharge home. Pertinent diagnoses were discussed with the patient. Patient was given return precautions.      Dontreal Miera, MD 03/03/21 2337

## 2021-03-31 ENCOUNTER — Ambulatory Visit: Payer: 59 | Attending: Sports Medicine | Admitting: Physical Therapy

## 2021-03-31 ENCOUNTER — Other Ambulatory Visit: Payer: Self-pay

## 2021-03-31 ENCOUNTER — Encounter: Payer: Self-pay | Admitting: Physical Therapy

## 2021-03-31 DIAGNOSIS — R293 Abnormal posture: Secondary | ICD-10-CM | POA: Diagnosis present

## 2021-03-31 DIAGNOSIS — M25612 Stiffness of left shoulder, not elsewhere classified: Secondary | ICD-10-CM | POA: Diagnosis present

## 2021-03-31 DIAGNOSIS — M6281 Muscle weakness (generalized): Secondary | ICD-10-CM | POA: Diagnosis present

## 2021-03-31 DIAGNOSIS — M25512 Pain in left shoulder: Secondary | ICD-10-CM

## 2021-03-31 NOTE — Patient Instructions (Addendum)
     Access Code: [email or web address] URL: [email or web address] Date: 03/31/2021 Prepared by: Glenetta Hew  Exercises Seated Gentle Upper Trapezius Stretch (Mirrored) - 2-3 x daily - 7 x weekly - 3 reps - 30 sec hold Gentle Levator Scapulae Stretch - 2-3 x daily - 7 x weekly - 3 reps - 30 sec hold Single Arm Doorway Pec Stretch at 90 Degrees Abduction (Mirrored) - 2-3 x daily - 7 x weekly - 3 reps - 30 sec hold Standing Bilateral Low Shoulder Row with Anchored Resistance - 2 x daily - 7 x weekly - 2 sets - 10 reps - 5 sec hold Scapular Retraction with Resistance Advanced - 2 x daily - 7 x weekly - 2 sets - 10 reps - 5 sec hold

## 2021-03-31 NOTE — Therapy (Signed)
[facility] [address]  [address] [place], [place], [address] Phone: [phone]   Fax:  [phone]  Physical Therapy Evaluation  Patient Details  Name: Courtney [name] MRN: [id number] Date of Birth: June 01, 1991 Referring Provider (PT): Albertha Ghee, MD   Encounter Date: 03/31/2021   PT End of Session - 03/31/21 0932     Visit Number 1    Number of Visits 12    Date for PT Re-Evaluation 05/12/21    Authorization Type U[employer] - VL: 60    PT Start Time 0932    PT Stop Time 1020    PT Time Calculation (min) 48 min    Activity Tolerance Patient tolerated treatment well    Behavior During Therapy [facility] for tasks assessed/performed             Past Medical History:  Diagnosis Date   Allergy    Anemia    no current med.   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    Past Surgical History:  Procedure Laterality Date   KNEE ARTHROSCOPY WITH LATERAL RELEASE Right 07/29/2015   Procedure: RIGHT KNEE ARTHROSCOPY CHONDROPLASTY WITH LATERAL RELEASE;  Surgeon: Loreta Ave, MD;  Location: [facility];  Service: Orthopedics;  Laterality: Right;   LAPAROSCOPY N/A 04/30/2014   Procedure: LAPAROSCOPY DIAGNOSTIC;  Surgeon: Geryl Rankins, MD;  Location: [facility];  Service: Gynecology;  Laterality: N/A;   SYNOVECTOMY Right 07/29/2015   Procedure: LIMITED SYNOVECTOMY PLICA ;  Surgeon: Loreta Ave, MD;  [facility];  Service: Orthopedics;  Laterality: Right;    There were no vitals filed for this  visit.    Subjective Assessment - 03/31/21 0937     Subjective Pt reports she does martial arts and was practicing self-defense with a much lariger partner when she felt her shoulder "crunch" during a takedown on 03/03/21. Had pain initially and was unable to fully raise her arm. 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 Also has been taking swimming lession and notes difficulty with freestyle stroke.    Diagnostic tests 03/15/21 MR arthrogram L shoulder: Grade 1 AC joint injury. Possible tiny superior labral tear at biceps labral anchor.    Patient Stated Goals "to be able to get back to wokr as an EMT"    Currently in Pain? No/denies    Pain Score 0-No pain   up to 5/10 with some motions   Pain Location Shoulder    Pain Orientation Left;Anterior;Posterior    Pain Descriptors / Indicators Sharp   "twinge"   Pain Type Acute pain    Pain Onset 1 to 4 weeks ago    Pain Frequency Intermittent    Aggravating Factors  reaching overhead out to the side or behind; lifting    Pain Relieving Factors meloxicam    Effect of Pain on Daily Activities unable to work as an EMT; difficulty lifting esp flipping something  over her shoulder; pain with freestyle swimming; limited participation in martial arts                [facility] PT Assessment - 03/31/21 0932       Assessment   Medical Diagnosis L shoulder AC joint contusion + small labral tear    Referring Provider (PT) Albertha Ghee, MD    Onset Date/Surgical Date 03/03/21    Hand Dominance Right    Next MD Visit none scheduled    Prior Therapy none      Restrictions   Weight Bearing Restrictions No      Balance Screen   Has the patient fallen in the past 6 months No    Has the patient had a decrease in activity level because of a fear of falling?  No    Is the patient reluctant to leave their home because of a fear of falling?  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); PT nanny & doula    Leisure martial arts, swimming      Cognition   Overall Cognitive Status Within Functional Limits for tasks assessed      Observation/Other Assessments   Focus on Therapeutic Outcomes (FOTO)  Shoulder = 56, predicted D/C FS = 75      Posture/Postural Control   Posture/Postural Control Postural limitations    Posture Comments L shoulder mildly elevated and protracted      ROM / Strength   AROM / PROM / Strength AROM;Strength      AROM   AROM Assessment Site Shoulder    Right/Left Shoulder Right;Left    Right Shoulder Extension 56 Degrees    Right Shoulder Flexion 170 Degrees    Right Shoulder ABduction 170 Degrees    Right Shoulder Internal Rotation --   FIR to T8   Right Shoulder External Rotation --   FER to T3   Left Shoulder Extension 40 Degrees    Left Shoulder Flexion 159 Degrees    Left Shoulder ABduction 164 Degrees    Left Shoulder Internal Rotation --   FIR to T10   Left Shoulder External Rotation --   FER to T3     Strength   Strength Assessment Site Shoulder    Right/Left Shoulder Right;Left    Right Shoulder Flexion 5/5    Right Shoulder Extension 5/5    Right Shoulder ABduction 5/5    Right Shoulder Internal Rotation 5/5    Right Shoulder External Rotation 5/5    Left Shoulder Flexion 4/5    Left Shoulder Extension 4/5    Left Shoulder ABduction 4/5    Left Shoulder Internal Rotation 4+/5    Left Shoulder External Rotation 4/5      Palpation   Palpation comment increased muscle tension & TTP in pecs & upper shoulder                        Objective measurements completed on examination: See above findings.       [facility] Adult PT Treatment/Exercise - 03/31/21 0932       Exercises   Exercises Shoulder      Shoulder  Exercises: Standing   Row Both;10 reps;Strengthening;Theraband    Theraband  Level (Shoulder Row) Level 2 (Red)    Retraction Both;10 reps;Strengthening;Theraband    Theraband Level (Shoulder Retraction) Level 2 (Red)      Shoulder Exercises: Stretch   Other Shoulder Stretches L UT & LS stretches x 30 sec each    Other Shoulder Stretches L mid doorway pec stretch x 30 sec                    PT Education - 03/31/21 1017     Education Details PT eval findings, anticipated POC & initial HEP - Access Code: [email or web address]    Person(s) Educated Patient    Methods Explanation;Demonstration;Verbal cues;Handout    Comprehension Verbalized understanding;Verbal cues required;Returned demonstration;Need further instruction              PT Short Term Goals - 03/31/21 1020       PT SHORT TERM GOAL #1   Title Patient will be independent with initial HEP    Status New    Target Date 04/21/21      PT SHORT TERM GOAL #2   Title Patient to improve L shoulder AROM to WNL without pain provocation    Status New    Target Date 04/21/21               PT Long Term Goals - 03/31/21 1020       PT LONG TERM GOAL #1   Title Patient will be independent with ongoing/advanced HEP for self-management at home    Status New    Target Date 05/12/21      PT LONG TERM GOAL #2   Title Patient will demonstrate improved L shoulder strength to >/= 4+/5 for functional UE use    Status New    Target Date 05/12/21      PT LONG TERM GOAL #3   Title Patient will perform work-related/simulated tasks without limitation due to L shoulder pain, LOM or weakness to allow for return to work as an EMT    Status New    Target Date 05/12/21      PT LONG TERM GOAL #4   Title Patient to report ability to perform ADLs, household, and reacreational activities without limitation due to L shoulder pain, LOM or weakness    Status New    Target Date 05/12/21                    Plan - 03/31/21 1020      Clinical Impression Statement Courtney [name] is a 30 y/o female who presents to OP for L shoulder AC joint contusion with possible small labral tear. L shoulder injured during martial arts practice on 03/03/21 with initial significant pain and loss of motion/function but she notes this is slowly improving. 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 Deficits include mildly elevated and rounded L shoulder posture, mildly limited L shoulder AROM with pain in overhead ROM and with extremes of IR/ER, increased muscle tension/tightness in upper, anterior and posterior shoulder, and mild weakness with decreased scapular muscle activation. Courtney [name] will benefit from skilled PT to address above deficits, improve posture and restore pain-free ROM and strength to allow her to resume normal daily activities, exercise (marital arts and swimming) and return to work as an EMT without pain interference.    Personal Factors  and Comorbidities Comorbidity 1;Past/Current Experience;Profession;Time since onset of injury/illness/exacerbation    Comorbidities R knee OA,chondromalacia patellae & plica syndrome s/p chondroplasty with lateral release    Examination-Activity Limitations Lift;Carry;Reach Overhead    Examination-Participation Restrictions Cleaning;Community Activity;Interpersonal Relationship;Occupation    Stability/Clinical Decision Making Stable/Uncomplicated    Clinical Decision Making Low    Rehab Potential Excellent    PT Frequency 2x / week    PT Duration 6 weeks    PT Treatment/Interventions ADLs/Self Care Home Management;Cryotherapy;Electrical Stimulation;Iontophoresis 4mg /ml Dexamethasone;Moist Heat;Ultrasound;Functional mobility training;Therapeutic activities;Therapeutic exercise;Neuromuscular re-education;Manual techniques;Passive range of motion;Dry  needling;Taping;Vasopneumatic Device;Joint Manipulations;Other (comment)   Work simualtion   PT Next Visit Plan Review initial HEP; L shoulder stretching/ROM and strengthening; postural strengthening/stabilization; manual therapy and modalities PRN    PT Home Exercise Plan Access Code: [email or web address] (6/30)    Consulted and Agree with Plan of Care Patient             Patient will benefit from skilled therapeutic intervention in order to improve the following deficits and impairments:  Decreased activity tolerance, Decreased knowledge of precautions, Decreased range of motion, Decreased strength, Impaired perceived functional ability, Impaired flexibility, Improper body mechanics, Postural dysfunction, Pain, Impaired UE functional use  Visit Diagnosis: Acute pain of left shoulder  Stiffness of left shoulder, not elsewhere classified  Muscle weakness (generalized)  Abnormal posture     Problem List Patient Active Problem List   Diagnosis Date Noted   IBS (irritable bowel syndrome) 06/22/2017    06/24/2017, PT, MPT 03/31/2021, 6:23 PM  [facility] [address]  [address] [place], [place], [place] Phone: [phone]   Fax:  [phone]  Name: Courtney [name] MRN: [id number] Date of Birth: 1990-11-06

## 2021-04-11 ENCOUNTER — Ambulatory Visit: Payer: 59 | Attending: Sports Medicine | Admitting: Physical Therapy

## 2021-04-11 ENCOUNTER — Encounter: Payer: Self-pay | Admitting: Physical Therapy

## 2021-04-11 ENCOUNTER — Other Ambulatory Visit: Payer: Self-pay

## 2021-04-11 DIAGNOSIS — M25512 Pain in left shoulder: Secondary | ICD-10-CM | POA: Diagnosis not present

## 2021-04-11 DIAGNOSIS — M25612 Stiffness of left shoulder, not elsewhere classified: Secondary | ICD-10-CM | POA: Diagnosis present

## 2021-04-11 DIAGNOSIS — M6281 Muscle weakness (generalized): Secondary | ICD-10-CM | POA: Diagnosis present

## 2021-04-11 DIAGNOSIS — R293 Abnormal posture: Secondary | ICD-10-CM | POA: Diagnosis present

## 2021-04-11 NOTE — Therapy (Signed)
[facility] [address]  [address] [place], [place], [address] Phone: [phone]   Fax:  [phone]  Physical Therapy Treatment  Patient Details  Name: Courtney [name] MRN: [id number] Date of Birth: 1991/01/22 Referring Provider (PT): Albertha Ghee, MD   Encounter Date: 04/11/2021   PT End of Session - 04/11/21 0929     Visit Number 2    Number of Visits 12    Date for PT Re-Evaluation 05/12/21    Authorization Type UHC - VL: 60    PT Start Time 0929    PT Stop Time 1015    PT Time Calculation (min) 46 min    Activity Tolerance Patient tolerated treatment well    Behavior During Therapy [facility] for tasks assessed/performed             Past Medical History:  Diagnosis Date   Allergy    Anemia    no current med.   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    Past Surgical History:  Procedure Laterality Date   KNEE ARTHROSCOPY WITH LATERAL RELEASE Right 07/29/2015   Procedure: RIGHT KNEE ARTHROSCOPY CHONDROPLASTY WITH LATERAL RELEASE;  Surgeon: Loreta Ave, MD;  Location: [facility];  Service: Orthopedics;  Laterality: Right;   LAPAROSCOPY N/A 04/30/2014   Procedure: LAPAROSCOPY DIAGNOSTIC;  Surgeon: Geryl Rankins, MD;  Location: [facility];  Service: Gynecology;  Laterality: N/A;   SYNOVECTOMY Right 07/29/2015   Procedure: LIMITED SYNOVECTOMY PLICA ;  Surgeon: Loreta Ave, MD;  Location: [facility];  Service: Orthopedics;  Laterality: Right;    There were no vitals filed for this visit.    Subjective Assessment - 04/11/21 0932     Subjective Pt reports her shoulder pain prevents her from sleeping on her sides. Pt reports some fatigue with HEP, but no other concerns    Diagnostic tests 03/15/21 MR arthrogram L shoulder: Grade 1 AC joint injury. Possible tiny superior labral tear at biceps labral anchor.    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 Yes    Pain Score 4     Pain Location Shoulder    Pain Orientation Left    Pain Descriptors / Indicators Aching    Pain Type Acute pain    Pain Frequency Intermittent                               [facility] Adult PT Treatment/Exercise - 04/11/21 0929       Exercises   Exercises Shoulder      Shoulder Exercises: Supine   External Rotation Left;AAROM;10 reps    External Rotation Limitations wand    Flexion Both;AAROM;Left;10 reps    Flexion Limitations wand    ABduction Left;AAROM;10 reps    ABduction Limitations wand scaption      Shoulder Exercises: Standing   Row Both;10 reps;Strengthening;Theraband  Theraband Level (Shoulder Row) Level 2 (Red)    Row Limitations cues to avoid excessive shoulder extension or upward rotation of elbows    Retraction Both;5 reps;10 reps;Strengthening;Theraband    Theraband Level (Shoulder Retraction) Level 2 (Red)    Retraction Limitations deferred initially after ~5 resp d/t increased anterior shoulder pain - better tolerance after STM & wand AAROM stretches      Shoulder Exercises: ROM/Strengthening   UBE (Upper Arm Bike) L1.0 x 6 min (3' fwd/3' back)      Shoulder Exercises: Stretch   Other Shoulder Stretches L UT & LS stretches x 30 sec each    Other Shoulder Stretches L mid doorway pec stretch 2 x 30 sec      Manual Therapy   Manual Therapy Joint mobilization;Soft tissue mobilization;Myofascial release;Passive ROM    Joint Mobilization L shoulder grade II-III distraction, infererior & A/P glides    Soft tissue  mobilization STM/DTM to L pecs, anterior deltoid, proximal biceps, teres group, subscapularis & lats - several taut bands/TPs identified    Myofascial Release manual TPR to L pecs, anterior deltoid, proximal biceps, teres group, subscapularis & lats    Passive ROM gentle L shoulder PROM all planes - mild restriction noted but close to full ROM available                    PT Education - 04/11/21 1010     Education Details Discussion of support for L UE in side sleeping; instruction in use of tennis ball for self-STM    Person(s) Educated Patient    Methods Explanation;Demonstration    Comprehension Verbalized understanding;Returned demonstration              PT Short Term Goals - 04/11/21 0935       PT SHORT TERM GOAL #1   Title Patient will be independent with initial HEP    Status On-going    Target Date 04/21/21      PT SHORT TERM GOAL #2   Title Patient to improve L shoulder AROM to WNL without pain provocation    Status On-going    Target Date 04/21/21               PT Long Term Goals - 04/11/21 0935       PT LONG TERM GOAL #1   Title Patient will be independent with ongoing/advanced HEP for self-management at home    Status On-going    Target Date 05/12/21      PT LONG TERM GOAL #2   Title Patient will demonstrate improved L shoulder strength to >/= 4+/5 for functional UE use    Status On-going    Target Date 05/12/21      PT LONG TERM GOAL #3   Title Patient will perform work-related/simulated tasks without limitation due to L shoulder pain, LOM or weakness 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 Pain now limiting her ability to sleep on either side. Discussed options for supporting her L arm in R side-lying to allow for improved sleeping tolerance. HEP reviewed with cues necessary for good scapular activation with rows and retraction while avoiding excessive shoulder extension or upward rotation of elbows during rows. Multiple taut bands and TPs identified in L shoulder musculature which were addressed with manual STM/MFR/TPR followed by joint mobilizations to reduce capsular tightness and AAROM for gentle stretching into end range. Sherhonda demonstrating improving ROM and reporting decreased pain by end of session, denying need for modalities.    Comorbidities R knee OA,chondromalacia patellae & plica syndrome s/p chondroplasty with lateral release    Rehab Potential Excellent    PT Frequency 2x / week    PT Duration 6 weeks    PT Treatment/Interventions ADLs/Self Care Home Management;Cryotherapy;Electrical Stimulation;Iontophoresis 4mg /ml Dexamethasone;Moist Heat;Ultrasound;Functional mobility training;Therapeutic activities;Therapeutic exercise;Neuromuscular re-education;Manual techniques;Passive range of motion;Dry needling;Taping;Vasopneumatic Device;Joint Manipulations;Other (comment)   Work simulation   PT Next Visit Plan L shoulder stretching/ROM and strengthening; postural strengthening/stabilization; manual therapy and modalities PRN; review and update HEP PRN    PT Home Exercise Plan Access Code: [email or web address] (6/30)    Consulted and Agree with Plan of Care Patient             Patient will benefit from skilled therapeutic intervention in order to improve the following deficits and impairments:  Decreased activity tolerance, Decreased knowledge of precautions, Decreased range of motion, Decreased strength, Impaired perceived functional ability, Impaired flexibility, Improper body mechanics, Postural  dysfunction, Pain, Impaired UE functional use  Visit Diagnosis: Acute pain of left shoulder  Stiffness of left shoulder, not elsewhere classified  Muscle weakness (generalized)  Abnormal posture     Problem List Patient Active Problem List   Diagnosis Date Noted   IBS (irritable bowel syndrome) 06/22/2017    06/24/2017, PT, MPT 04/11/2021, 12:08 PM  [facility] [address]  [address] [place], [place], [place] Phone: [phone]   Fax:  [phone]  Name: AHNESTY FINFROCK MRN: Angela Adam Date of Birth: 1991/09/04

## 2021-04-19 ENCOUNTER — Ambulatory Visit: Payer: 59 | Admitting: Physical Therapy

## 2021-04-21 ENCOUNTER — Other Ambulatory Visit: Payer: Self-pay

## 2021-04-21 ENCOUNTER — Ambulatory Visit: Payer: 59

## 2021-04-21 DIAGNOSIS — M25612 Stiffness of left shoulder, not elsewhere classified: Secondary | ICD-10-CM

## 2021-04-21 DIAGNOSIS — M6281 Muscle weakness (generalized): Secondary | ICD-10-CM

## 2021-04-21 DIAGNOSIS — M25512 Pain in left shoulder: Secondary | ICD-10-CM | POA: Diagnosis not present

## 2021-04-21 DIAGNOSIS — R293 Abnormal posture: Secondary | ICD-10-CM

## 2021-04-21 NOTE — Therapy (Addendum)
[facility] [address]  [place], [place], [address] Phone: [phone]   Fax:  [phone]  Physical Therapy Treatment / Discharge Summary  Patient Details  Name: Courtney [name] MRN: [id number] Date of Birth: [birth year] Referring Provider (PT): Wandra Feinstein, MD   Encounter Date: 04/21/2021   PT End of Session - 04/21/21 0945     Visit Number 3    Number of Visits 12    Date for PT Re-Evaluation 05/12/21    Authorization Type UHC - VL: 60    PT Start Time 0848    PT Stop Time 0933    PT Time Calculation (min) 45 min    Activity Tolerance Patient tolerated treatment well    Behavior During Therapy [facility] for tasks assessed/performed             Past Medical History:  Diagnosis Date   Allergy    Anemia    no current med.   Chondromalacia patellae of right knee 36/6440   Complication of anesthesia    states was told she "slept longer in recovery than most people"   Heart murmur    pt states mom told her she has heart murmur   Hives    on and off   IBS (irritable bowel syndrome) 06/22/2017   Irritable bowel syndrome with constipation    alternates constipation with diarrhea   Osteoarthritis of right knee 34/7425   Plica syndrome of right knee 07/2015    Past Surgical History:  Procedure Laterality Date   KNEE ARTHROSCOPY WITH LATERAL RELEASE Right 07/29/2015   Procedure: RIGHT KNEE ARTHROSCOPY CHONDROPLASTY WITH LATERAL RELEASE;  Surgeon: Ninetta Lights, MD;  Location: [place];  Service: Orthopedics;  Laterality: Right;   LAPAROSCOPY N/A 04/30/2014   Procedure: LAPAROSCOPY DIAGNOSTIC;  Surgeon: Thurnell Lose, MD;  Location: [facility];  Service: Gynecology;  Laterality: N/A;   SYNOVECTOMY Right 07/29/2015   Procedure: LIMITED SYNOVECTOMY PLICA ;  Surgeon: Ninetta Lights, MD;  Location: [place];  Service: Orthopedics;  Laterality: Right;    There were no vitals filed  for this visit.   Subjective Assessment - 04/21/21 0853     Subjective Pt reports no problems with her L shoulder over the past few days.    Diagnostic tests 03/15/21 MR arthrogram L shoulder: Grade 1 AC joint injury. Possible tiny superior labral tear at biceps labral anchor.    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 No/denies                               [facility] Adult PT Treatment/Exercise - 04/21/21 0001       Self-Care   Self-Care Other Self-Care Comments    Other Self-Care Comments  edu on posture, keeping humeral head in line with torso      Exercises   Exercises Shoulder      Shoulder Exercises: Supine   Flexion AROM;Left;10 reps;Weights    Shoulder Flexion Weight (lbs) 1      Shoulder Exercises: Sidelying   External Rotation Strengthening;Left;10 reps;Weights    External Rotation Weight (lbs) 1      Shoulder Exercises: Standing   Horizontal ABduction Strengthening;Both;10 reps;Theraband    Theraband Level (Shoulder Horizontal ABduction) Level 1 (Yellow)    External Rotation Strengthening;Left;10 reps;Theraband  Theraband Level (Shoulder External Rotation) Level 1 (Yellow)    Internal Rotation Strengthening;Left;10 reps;Theraband    Theraband Level (Shoulder Internal Rotation) Level 1 (Yellow)    Extension Strengthening;Both;10 reps;Theraband    Theraband Level (Shoulder Extension) Level 3 (Green)    Row Both;10 reps;Strengthening;Theraband    Theraband Level (Shoulder Row) Level 3 (Green)    Row Limitations cues to avoid excessive shoulder extension      Shoulder Exercises: ROM/Strengthening   UBE (Upper Arm Bike) L1.5 x 6 min (3' fwd/3' back)      Shoulder Exercises: Stretch   Other Shoulder Stretches L pec doorway 2x30"                      PT Short Term Goals - 04/21/21 0946       PT SHORT TERM GOAL #1   Title Patient will be independent with initial HEP    Status Achieved     Target Date 04/21/21      PT SHORT TERM GOAL #2   Title Patient to improve L shoulder AROM to WNL without pain provocation    Status Partially Met    Target Date 04/21/21               PT Long Term Goals - 04/11/21 0935       PT LONG TERM GOAL #1   Title Patient will be independent with ongoing/advanced HEP for self-management at home    Status On-going    Target Date 05/12/21      PT LONG TERM GOAL #2   Title Patient will demonstrate improved L shoulder strength to >/= 4+/5 for functional UE use    Status On-going    Target Date 05/12/21      PT LONG TERM GOAL #3   Title Patient will perform work-related/simulated tasks without limitation due to L shoulder pain, LOM or weakness 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 She demonstrated a good tolerance for the exercises today but noted that her L shoulder became fatigued. Cues required to prevent excessive shoulder extension with rows and to keep correct positon of elbows with the ER/IR exercises. I talked to her about posture and trying to keep her shoulders slightly retracted at rest to promote better mechanics of the [facility] joint, she demonstrated understanding. Pt declined needing to review HEP so STG 1 is met. She also demonstrated L shoulder AROM WNL but formal measurements were not taken so this goal as of now is partially met.    Personal Factors and Comorbidities Comorbidity 1;Past/Current Experience;Profession;Time since onset of injury/illness/exacerbation    Comorbidities R knee  OA,chondromalacia patellae & plica syndrome s/p chondroplasty with lateral release    PT Frequency 2x / week    PT Duration 6 weeks    PT Treatment/Interventions ADLs/Self Care Home Management;Cryotherapy;Electrical Stimulation;Iontophoresis 4mg /ml Dexamethasone;Moist Heat;Ultrasound;Functional mobility training;Therapeutic activities;Therapeutic exercise;Neuromuscular re-education;Manual  techniques;Passive range of motion;Dry needling;Taping;Vasopneumatic Device;Joint Manipulations;Other (comment)    PT Next Visit Plan L shoulder stretching/ROM and strengthening; postural strengthening/stabilization; manual therapy and modalities PRN; review and update HEP PRN    PT Home Exercise Plan Access Code: [email or web address] (6/30)    Consulted and Agree with Plan of Care Patient             Patient will benefit from skilled therapeutic intervention in order to improve the following deficits and impairments:  Decreased activity tolerance, Decreased knowledge of precautions, Decreased range of motion, Decreased strength, Impaired perceived functional ability, Impaired flexibility, Improper body mechanics, Postural dysfunction, Pain, Impaired UE functional use  Visit Diagnosis: Acute pain of left shoulder  Stiffness of left shoulder, not elsewhere classified  Muscle weakness (generalized)  Abnormal posture     Problem List Patient Active Problem List   Diagnosis Date Noted   IBS (irritable bowel syndrome) 06/22/2017    Artist Pais, PTA 04/21/2021, 10:00 AM  [facility] [address]  [place], [place], [address] Phone: [phone]   Fax:  [phone]  Name: Courtney [name] MRN: [id number] Date of Birth: 1991-03-07   [place] SUMMARY  Visits from Start of Care: 3  Current functional level related to goals / functional outcomes:   Refer to above clinical impression for status as of last visit on 04/21/2021.  Patient failed to schedule any further appointments and has not returned to PT in >30 days, therefore will proceed with discharge from PT for this episode.   Remaining deficits:   As above. Unable to formally assess status at discharge due to failure to return.   Education / Equipment:   HEP   Patient agrees to discharge. Patient goals were partially met. Patient is being discharged due to not returning since the last visit.   Percival Spanish, PT, MPT 07/05/21, 9:41 AM  [facility] [address]  [place], [place], [address] Phone: [phone]   Fax:  [phone]

## 2021-07-23 ENCOUNTER — Other Ambulatory Visit: Payer: Self-pay

## 2021-07-23 ENCOUNTER — Encounter (HOSPITAL_BASED_OUTPATIENT_CLINIC_OR_DEPARTMENT_OTHER): Payer: Self-pay | Admitting: *Deleted

## 2021-07-23 DIAGNOSIS — R491 Aphonia: Secondary | ICD-10-CM | POA: Diagnosis not present

## 2021-07-23 DIAGNOSIS — Z20822 Contact with and (suspected) exposure to covid-19: Secondary | ICD-10-CM | POA: Insufficient documentation

## 2021-07-23 DIAGNOSIS — R490 Dysphonia: Secondary | ICD-10-CM | POA: Insufficient documentation

## 2021-07-23 DIAGNOSIS — R059 Cough, unspecified: Secondary | ICD-10-CM | POA: Diagnosis not present

## 2021-07-23 NOTE — ED Triage Notes (Signed)
Cough since Sunday. Productive for brownish-green sputum. Denies fever

## 2021-07-23 NOTE — ED Notes (Signed)
Pt alert, NAD. HR 105, O2 sats 100%

## 2021-07-24 ENCOUNTER — Emergency Department (HOSPITAL_BASED_OUTPATIENT_CLINIC_OR_DEPARTMENT_OTHER)
Admission: EM | Admit: 2021-07-24 | Discharge: 2021-07-24 | Disposition: A | Discharge status: Home / Self Care | Payer: 59 | Attending: Emergency Medicine | Admitting: Emergency Medicine

## 2021-07-24 DIAGNOSIS — J209 Acute bronchitis, unspecified: Secondary | ICD-10-CM

## 2021-07-24 LAB — RESP PANEL BY RT-PCR (FLU A&B, COVID) ARPGX2
Influenza A by PCR: NEGATIVE
Influenza B by PCR: NEGATIVE
SARS Coronavirus 2 by RT PCR: NEGATIVE

## 2021-07-24 MED ORDER — ALBUTEROL SULFATE HFA 108 (90 BASE) MCG/ACT IN AERS
2.0000 | INHALATION_SPRAY | Freq: Once | RESPIRATORY_TRACT | Status: AC
  Administered 2021-07-24: 2 via RESPIRATORY_TRACT
  Filled 2021-07-24: qty 6.7

## 2021-07-24 MED ORDER — AZITHROMYCIN 250 MG PO TABS: ORAL_TABLET | ORAL | 0 refills | Status: DC

## 2021-07-24 NOTE — Discharge Instructions (Addendum)
Begin taking Zithromax as prescribed.  Use your inhaler, 2 puffs every 4 hours as needed for wheezing.  Return to the emergency department if symptoms significantly worsen or change.

## 2021-07-24 NOTE — ED Provider Notes (Signed)
[facility] EMERGENCY DEPARTMENT Provider Note   CSN: [id number] Arrival date & time: 07/23/21  2022     History Chief Complaint  Patient presents with   Cough    Courtney [name] is a 31 y.o. female.  Patient is a 30 year old female with past medical history of irritable bowel, anemia.  Patient presenting today with complaints of cough for the past week.  She describes cough that is intermittently productive of yellow sputum.  She denies chest pain or difficulty breathing.  She denies fevers or chills.  She does describe some loss of her voice and hoarseness.  Patient works as an Museum/gallery exhibitions officer with [employer] and tells me she has done COVID testa at work that have been negative  The history is provided by the patient.      Past Medical History:  Diagnosis Date   Allergy    Anemia    no current med.   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    Patient Active Problem List   Diagnosis Date Noted   IBS (irritable bowel syndrome) 06/22/2017    Past Surgical History:  Procedure Laterality Date   KNEE ARTHROSCOPY WITH LATERAL RELEASE Right 07/29/2015   Procedure: RIGHT KNEE ARTHROSCOPY CHONDROPLASTY WITH LATERAL RELEASE;  Surgeon: Loreta Ave, MD;  Location: [facility];  Service: Orthopedics;  Laterality: Right;   LAPAROSCOPY N/A 04/30/2014   Procedure: LAPAROSCOPY DIAGNOSTIC;  Surgeon: Geryl Rankins, MD;  Location: [facility];  Service: Gynecology;  Laterality: N/A;   SYNOVECTOMY Right 07/29/2015   Procedure: LIMITED SYNOVECTOMY PLICA ;  Surgeon: Loreta Ave, MD;   Location: [facility];  Service: Orthopedics;  Laterality: Right;     OB History   No obstetric history on file.     Family History  Problem Relation Age of Onset   Colon cancer Neg Hx    Colon polyps Neg Hx    Rectal cancer Neg Hx    Stomach cancer Neg Hx     Social History   Tobacco Use   Smoking status: Never   Smokeless tobacco: Never  Vaping Use   Vaping Use: Never used  Substance Use Topics   Alcohol use: No   Drug use: No    Home Medications Prior to Admission medications   Medication Sig Start Date End Date Taking? Authorizing Provider  Diclofenac Sodium CR 100 MG 24 hr tablet Take 1 tablet (100 mg total) by mouth daily. Patient not taking: Reported on 03/31/2021 03/03/21   Nicanor Alcon, April, MD  levonorgestrel [facility]) 20 MCG/24HR IUD 1 each by Intrauterine route once.    [provider]  lidocaine (LIDODERM) 5 % Place 1 patch onto the skin daily. Remove & Discard patch within 12 hours or as directed by MD 03/03/21   Nicanor Alcon, April, MD  meloxicam (MOBIC) 15 MG tablet Take 15 mg by mouth daily.    [provider]  Vitamin D, Ergocalciferol, (DRISDOL) 1.25 MG (50000 UNIT) CAPS capsule Take 50,000 Units by mouth once a week.  05/25/20   [provider]  VYVANSE 50 MG capsule Take 50 mg by mouth daily. 08/02/20   [provider]    Allergies    Patient has no known allergies.  Review of Systems   Review of Systems  All other systems reviewed and are negative.  Physical Exam Updated Vital Signs BP 117/79 (BP Location: Left Arm)   Pulse 94   Temp 97.8 F (36.6 C) (Oral)   Resp 20   Ht 5\' 5"  (1.651 m)   Wt 70.3 kg   SpO2 100%   BMI 25.79 kg/m   Physical Exam Vitals and nursing note reviewed.  Constitutional:      General: She is not in acute distress.    Appearance: She is well-developed. She is not diaphoretic.  HENT:     Head: Normocephalic and atraumatic.     Right Ear: Tympanic membrane normal.     Left  Ear: Tympanic membrane normal.     Mouth/Throat:     Mouth: Mucous membranes are moist.  Cardiovascular:     Rate and Rhythm: Normal rate and regular rhythm.     Heart sounds: No murmur heard.   No friction rub. No gallop.  Pulmonary:     Effort: Pulmonary effort is normal. No respiratory distress.     Breath sounds: Normal breath sounds. No wheezing.  Abdominal:     General: Bowel sounds are normal. There is no distension.     Palpations: Abdomen is soft.     Tenderness: There is no abdominal tenderness.  Musculoskeletal:        General: Normal range of motion.     Cervical back: Normal range of motion and neck supple.  Skin:    General: Skin is warm and dry.  Neurological:     General: No focal deficit present.     Mental Status: She is alert and oriented to person, place, and time.    ED Results / Procedures / Treatments   Labs (all labs ordered are listed, but only abnormal results are displayed) Labs Reviewed  RESP PANEL BY RT-PCR (FLU A&B, COVID) ARPGX2    EKG None  Radiology No results found.  Procedures Procedures   Medications Ordered in ED Medications  albuterol (VENTOLIN HFA) 108 (90 Base) MCG/ACT inhaler 2 puff (has no administration in time range)    ED Course  I have reviewed the triage vital signs and the nursing notes.  Pertinent labs & imaging results that were available during my care of the patient were reviewed by me and considered in my medical decision making (see chart for details).    MDM Rules/Calculators/A&P  Patient's COVID test is negative.  Symptoms likely viral, however have been present for 1 week and worsening.  Patient will be prescribed Zithromax which she can take along with her inhaler.  To follow-up as needed for any problems.  Final Clinical Impression(s) / ED Diagnoses Final diagnoses:  None    Rx / DC Orders ED Discharge Orders     None        , MD 07/24/21 [phone]

## 2022-06-26 ENCOUNTER — Other Ambulatory Visit: Payer: Self-pay | Admitting: Orthopedic Surgery

## 2022-06-26 ENCOUNTER — Other Ambulatory Visit (HOSPITAL_COMMUNITY): Payer: Self-pay | Admitting: Orthopedic Surgery

## 2022-06-26 DIAGNOSIS — S4992XA Unspecified injury of left shoulder and upper arm, initial encounter: Secondary | ICD-10-CM

## 2022-10-30 ENCOUNTER — Other Ambulatory Visit (HOSPITAL_BASED_OUTPATIENT_CLINIC_OR_DEPARTMENT_OTHER): Payer: Self-pay

## 2022-10-30 MED ORDER — LISDEXAMFETAMINE DIMESYLATE 50 MG PO CAPS
50.0000 mg | ORAL_CAPSULE | Freq: Every day | ORAL | 0 refills | Status: DC
  Filled 2022-10-30: qty 30, 30d supply, fill #0

## 2022-12-11 ENCOUNTER — Other Ambulatory Visit (HOSPITAL_BASED_OUTPATIENT_CLINIC_OR_DEPARTMENT_OTHER): Payer: Self-pay

## 2022-12-11 MED ORDER — LISDEXAMFETAMINE DIMESYLATE 50 MG PO CAPS
50.0000 mg | ORAL_CAPSULE | Freq: Every day | ORAL | 0 refills | Status: DC
  Filled 2022-12-11: qty 30, 30d supply, fill #0

## 2022-12-12 ENCOUNTER — Other Ambulatory Visit (HOSPITAL_BASED_OUTPATIENT_CLINIC_OR_DEPARTMENT_OTHER): Payer: Self-pay

## 2023-01-06 IMAGING — DX DG SHOULDER 2+V*L*
3 series · 3 of 3 positions shown · non-contrast
Comparison: None.

CLINICAL DATA: Left shoulder injury

EXAM:
LEFT SHOULDER - 2+ VIEW

[shoulder grashey]
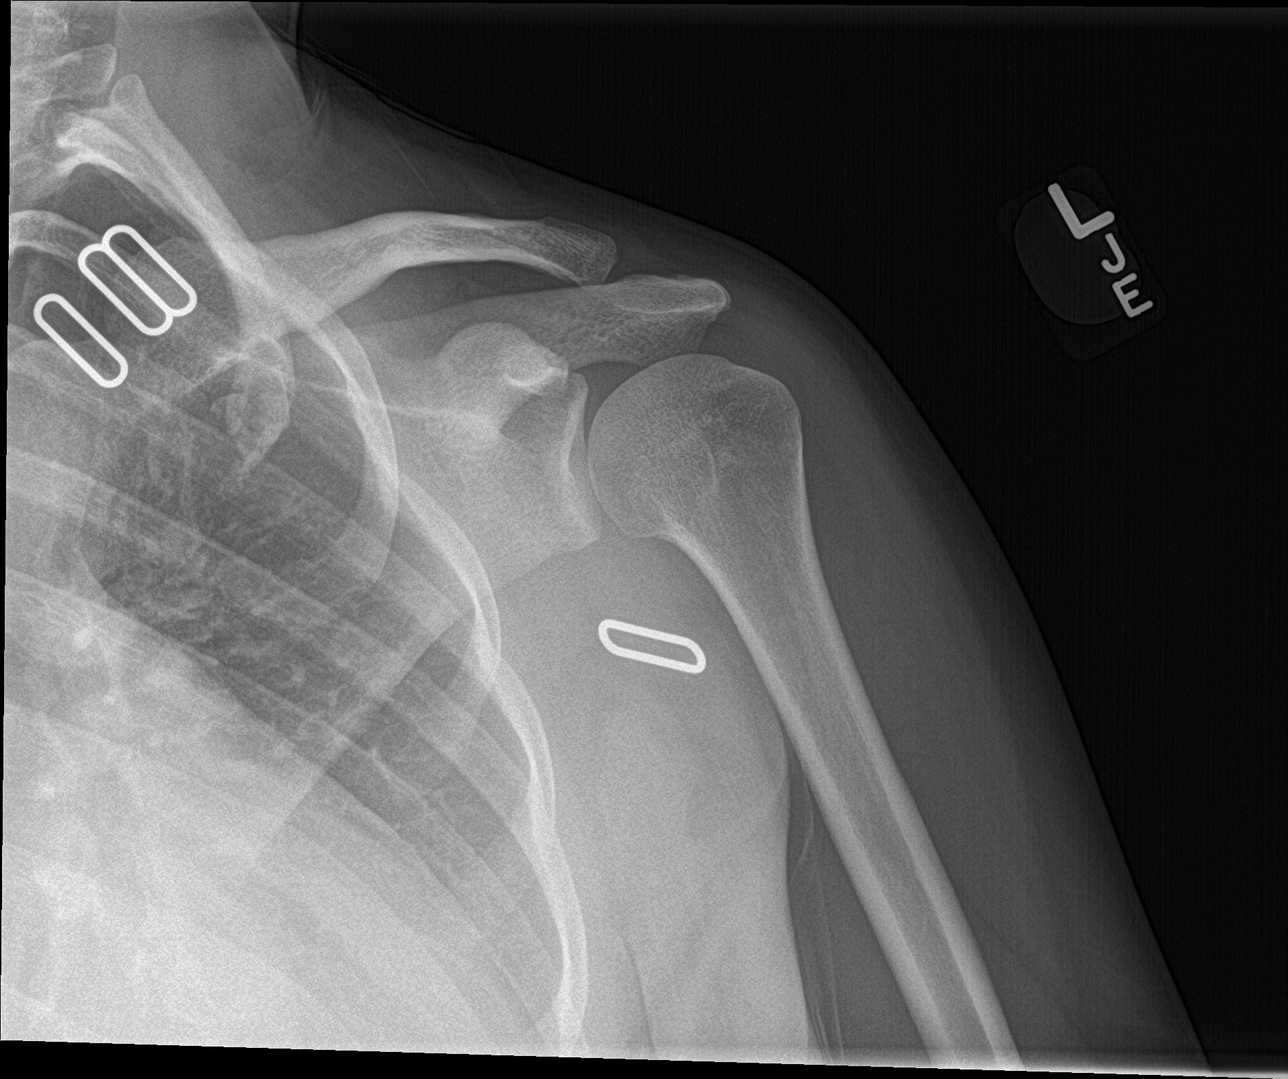

[shoulder y view]
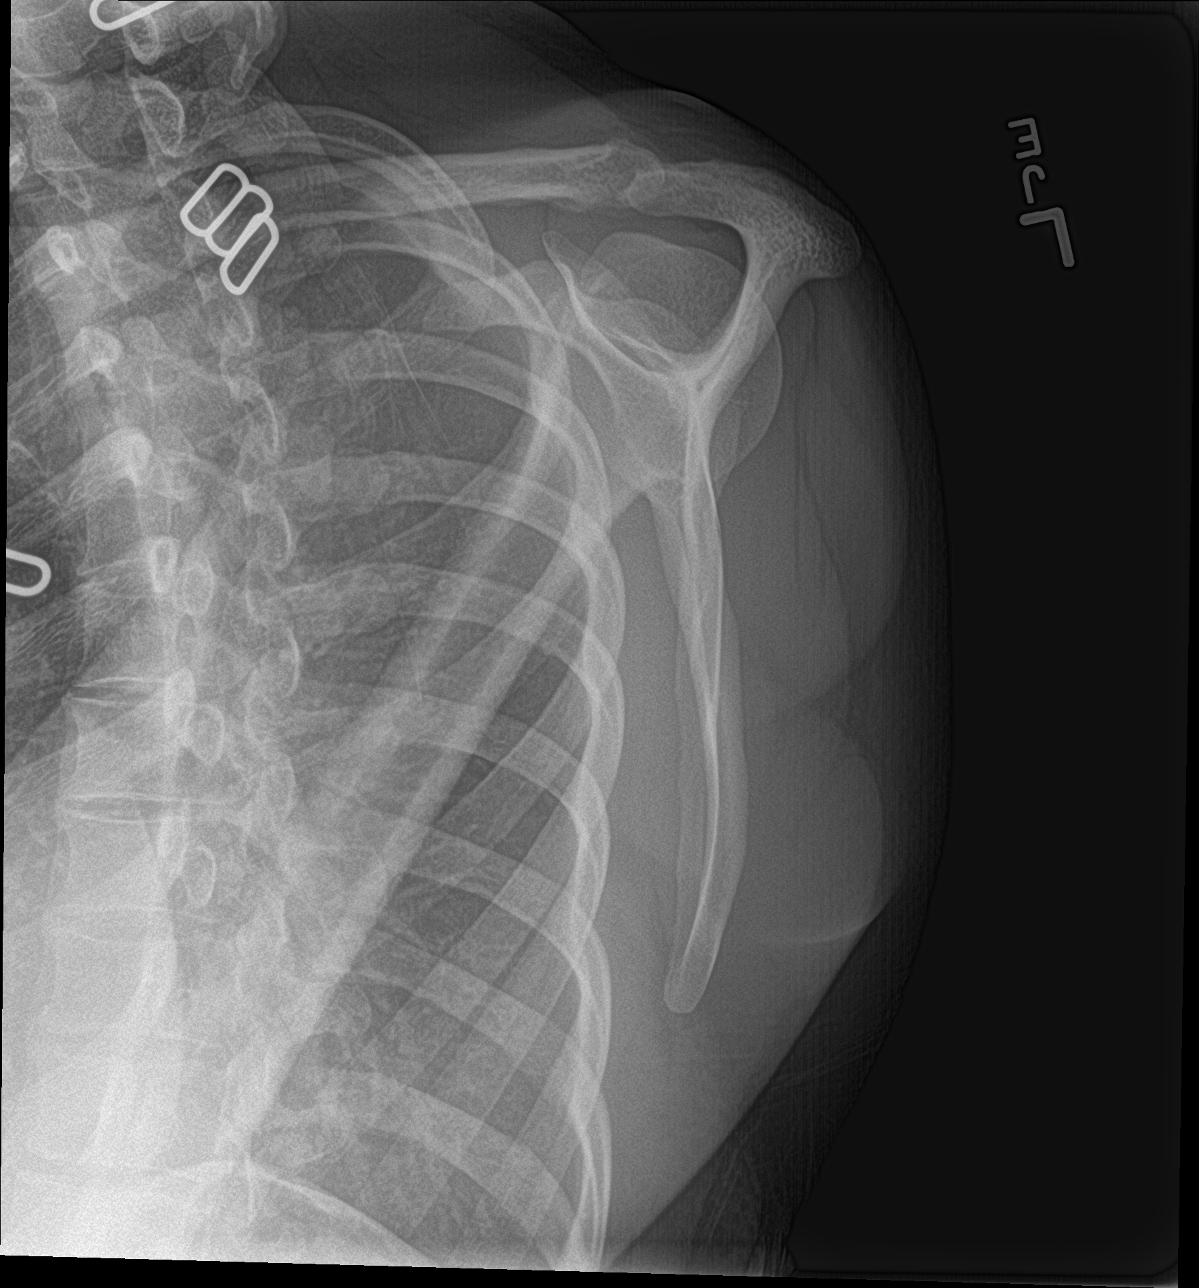

[shoulder ap neutral]
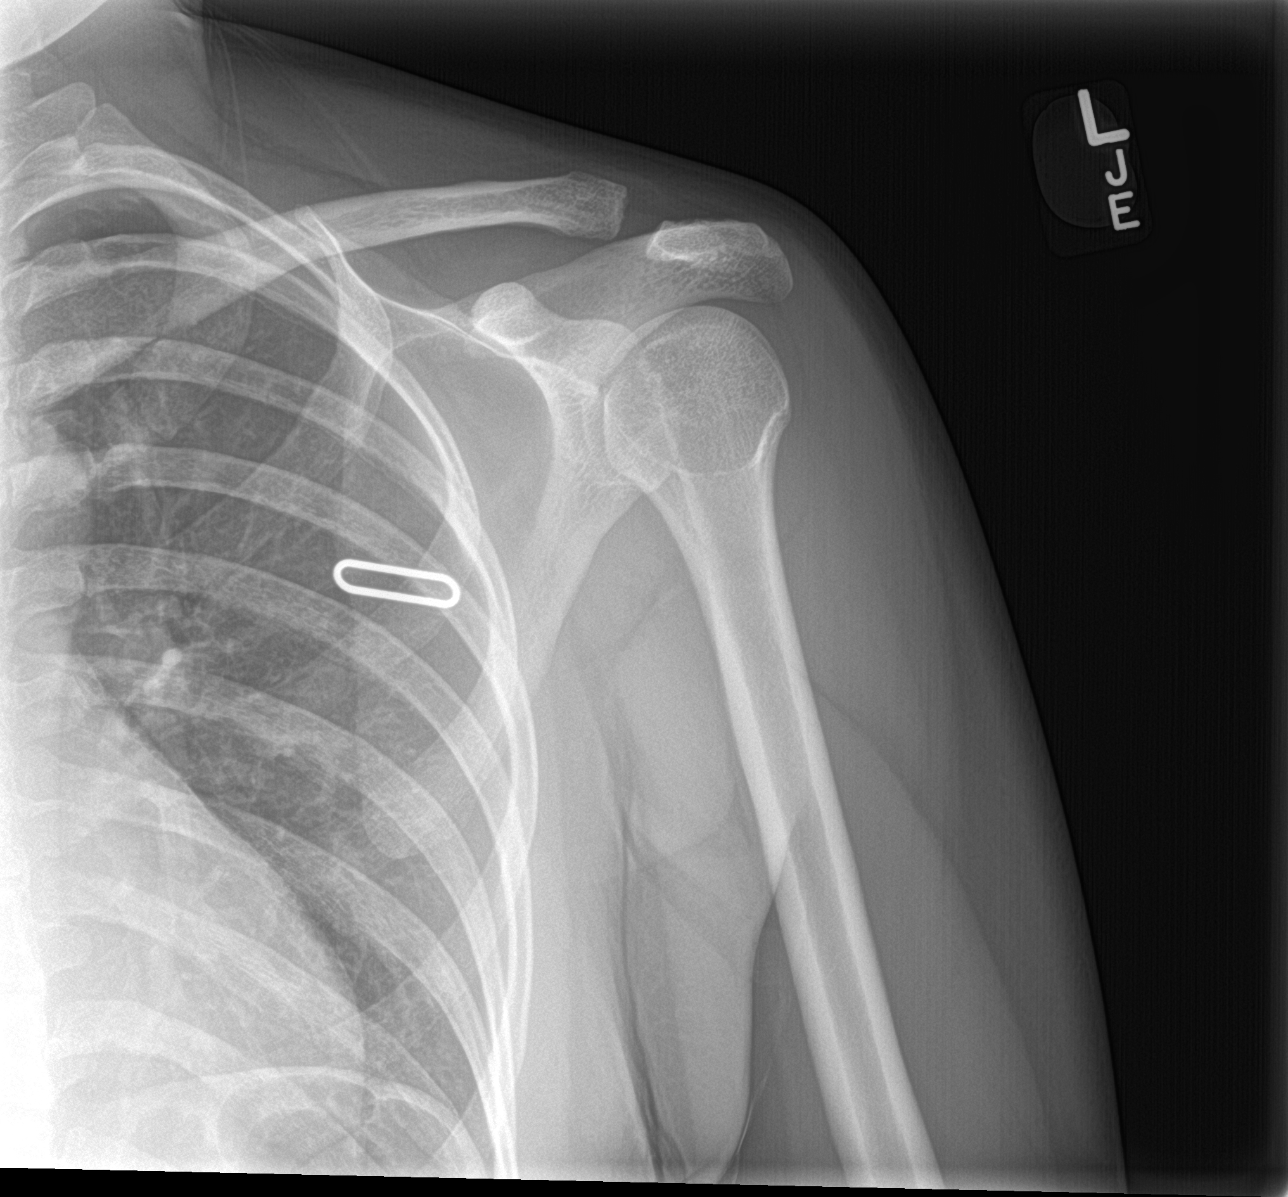

[3 of 3 positions shown; findings below may reference images not displayed]

FINDINGS: AC joint appears widened. Glenohumeral joint is intact. No fracture.
Soft tissues are intact.
IMPRESSION: Apparent left AC joint widening suggesting AC joint separation.
Dedicated bilateral AC joint views with and without weights may be
helpful to further evaluate.

## 2023-01-08 ENCOUNTER — Other Ambulatory Visit (HOSPITAL_BASED_OUTPATIENT_CLINIC_OR_DEPARTMENT_OTHER): Payer: Self-pay

## 2023-01-08 MED ORDER — LORATADINE 10 MG PO TABS
10.0000 mg | ORAL_TABLET | Freq: Every day | ORAL | 0 refills | Status: DC
  Filled 2023-01-08: qty 30, 30d supply, fill #0

## 2023-01-08 MED ORDER — ALBUTEROL SULFATE HFA 108 (90 BASE) MCG/ACT IN AERS
1.0000 | INHALATION_SPRAY | RESPIRATORY_TRACT | 0 refills | Status: DC | PRN
  Filled 2023-01-08: qty 6.7, 20d supply, fill #0

## 2023-01-08 MED ORDER — FLUTICASONE PROPIONATE 50 MCG/ACT NA SUSP
1.0000 | Freq: Every day | NASAL | 5 refills | Status: DC
  Filled 2023-01-08: qty 16, 30d supply, fill #0

## 2023-01-08 MED ORDER — FLUTICASONE PROPIONATE HFA 44 MCG/ACT IN AERO
1.0000 | INHALATION_SPRAY | Freq: Two times a day (BID) | RESPIRATORY_TRACT | 5 refills | Status: DC
  Filled 2023-01-08 – 2023-01-09 (×2): qty 10.6, 30d supply, fill #0

## 2023-01-09 ENCOUNTER — Other Ambulatory Visit (HOSPITAL_BASED_OUTPATIENT_CLINIC_OR_DEPARTMENT_OTHER): Payer: Self-pay

## 2023-01-09 MED ORDER — QVAR REDIHALER 40 MCG/ACT IN AERB
1.0000 | INHALATION_SPRAY | Freq: Two times a day (BID) | RESPIRATORY_TRACT | 1 refills | Status: DC
  Filled 2023-01-09: qty 10.6, 30d supply, fill #0

## 2023-11-19 ENCOUNTER — Other Ambulatory Visit (HOSPITAL_BASED_OUTPATIENT_CLINIC_OR_DEPARTMENT_OTHER): Payer: Self-pay

## 2023-11-19 MED ORDER — LISDEXAMFETAMINE DIMESYLATE 50 MG PO CAPS
50.0000 mg | ORAL_CAPSULE | Freq: Every day | ORAL | 0 refills | Status: DC
  Filled 2023-11-19 – 2023-11-20 (×2): qty 30, 30d supply, fill #0

## 2023-11-20 ENCOUNTER — Other Ambulatory Visit (HOSPITAL_BASED_OUTPATIENT_CLINIC_OR_DEPARTMENT_OTHER): Payer: Self-pay

## 2023-11-21 ENCOUNTER — Ambulatory Visit: Payer: Self-pay

## 2023-11-21 ENCOUNTER — Other Ambulatory Visit (HOSPITAL_BASED_OUTPATIENT_CLINIC_OR_DEPARTMENT_OTHER): Payer: Self-pay

## 2023-11-21 ENCOUNTER — Other Ambulatory Visit: Payer: Self-pay | Admitting: Family Medicine

## 2023-11-21 DIAGNOSIS — S61251A Open bite of left index finger without damage to nail, initial encounter: Secondary | ICD-10-CM

## 2023-11-21 MED ORDER — AMOXICILLIN-POT CLAVULANATE 875-125 MG PO TABS
1.0000 | ORAL_TABLET | Freq: Two times a day (BID) | ORAL | 0 refills | Status: DC
  Filled 2023-11-21: qty 14, 7d supply, fill #0

## 2023-12-25 ENCOUNTER — Other Ambulatory Visit (HOSPITAL_BASED_OUTPATIENT_CLINIC_OR_DEPARTMENT_OTHER): Payer: Self-pay

## 2023-12-25 MED ORDER — LISDEXAMFETAMINE DIMESYLATE 50 MG PO CAPS
50.0000 mg | ORAL_CAPSULE | Freq: Every day | ORAL | 0 refills | Status: DC
  Filled 2023-12-25: qty 30, 30d supply, fill #0

## 2024-01-16 ENCOUNTER — Other Ambulatory Visit (HOSPITAL_BASED_OUTPATIENT_CLINIC_OR_DEPARTMENT_OTHER): Payer: Self-pay

## 2024-01-16 MED ORDER — METRONIDAZOLE 500 MG PO TABS
500.0000 mg | ORAL_TABLET | Freq: Two times a day (BID) | ORAL | 0 refills | Status: DC
  Filled 2024-01-16: qty 14, 7d supply, fill #0

## 2024-01-25 ENCOUNTER — Ambulatory Visit (INDEPENDENT_AMBULATORY_CARE_PROVIDER_SITE_OTHER): Payer: Self-pay | Admitting: Internal Medicine

## 2024-01-25 ENCOUNTER — Encounter: Payer: Self-pay | Admitting: Internal Medicine

## 2024-01-25 VITALS — BP 110/68 | HR 88 | Temp 97.2°F | Resp 20 | Ht 66.0 in | Wt 152.0 lb

## 2024-01-25 DIAGNOSIS — T781XXA Other adverse food reactions, not elsewhere classified, initial encounter: Secondary | ICD-10-CM

## 2024-01-25 DIAGNOSIS — J45991 Cough variant asthma: Secondary | ICD-10-CM

## 2024-01-25 DIAGNOSIS — T781XXD Other adverse food reactions, not elsewhere classified, subsequent encounter: Secondary | ICD-10-CM

## 2024-01-25 DIAGNOSIS — J4530 Mild persistent asthma, uncomplicated: Secondary | ICD-10-CM | POA: Diagnosis not present

## 2024-01-25 DIAGNOSIS — J310 Chronic rhinitis: Secondary | ICD-10-CM | POA: Diagnosis not present

## 2024-01-25 MED ORDER — LORATADINE 10 MG PO TABS: 10.0000 mg | ORAL_TABLET | Freq: Every day | ORAL | 5 refills | Status: DC

## 2024-01-25 MED ORDER — FLUTICASONE PROPIONATE 50 MCG/ACT NA SUSP: 1.0000 | Freq: Every day | NASAL | 5 refills | Status: DC

## 2024-01-25 MED ORDER — ALBUTEROL SULFATE HFA 108 (90 BASE) MCG/ACT IN AERS: 1.0000 | INHALATION_SPRAY | RESPIRATORY_TRACT | 0 refills | Status: DC | PRN

## 2024-01-25 MED ORDER — QVAR REDIHALER 40 MCG/ACT IN AERB: 1.0000 | INHALATION_SPRAY | Freq: Two times a day (BID) | RESPIRATORY_TRACT | 5 refills | Status: DC
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
## 2024-01-28 ENCOUNTER — Other Ambulatory Visit (HOSPITAL_BASED_OUTPATIENT_CLINIC_OR_DEPARTMENT_OTHER): Payer: Self-pay

## 2024-01-28 MED ORDER — LISDEXAMFETAMINE DIMESYLATE 50 MG PO CAPS
50.0000 mg | ORAL_CAPSULE | Freq: Every day | ORAL | 0 refills | Status: DC
  Filled 2024-01-28: qty 30, 30d supply, fill #0

## 2024-01-28 MED ORDER — METRONIDAZOLE 0.75 % VA GEL
1.0000 | Freq: Every day | VAGINAL | 0 refills | Status: AC
  Filled 2024-01-28: qty 70, 7d supply, fill #0

## 2024-01-30 ENCOUNTER — Encounter: Payer: Self-pay | Admitting: Internal Medicine

## 2024-01-30 ENCOUNTER — Ambulatory Visit: Admitting: Internal Medicine

## 2024-01-30 DIAGNOSIS — J3089 Other allergic rhinitis: Secondary | ICD-10-CM | POA: Diagnosis not present

## 2024-01-30 DIAGNOSIS — J302 Other seasonal allergic rhinitis: Secondary | ICD-10-CM | POA: Insufficient documentation

## 2024-01-30 DIAGNOSIS — L299 Pruritus, unspecified: Secondary | ICD-10-CM
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
## 2024-01-30 NOTE — Progress Notes (Signed)
 Date of Service/Encounter:  01/30/24  Allergy testing appointment   Initial visit on 01/25/24, seen for allergic rhinitis, pollen food allergy syndrome, suspected mild asthma.  Please see that note for additional details.  Today reports for allergy diagnostic testing:    DIAGNOSTICS:  Skin Testing: Environmental allergy panel and select foods. Adequate positive and negative controls. Results discussed with patient/family.  Airborne Adult Perc - 01/30/24 1635     Time Antigen Placed 0400    Allergen Manufacturer Floyd Hutchinson    Location Back    Number of Test 55    1. Control-Buffer 50% Glycerol Negative    2. Control-Histamine 2+    3. Bahia 2+    4. [place] 2+    5. Johnson 2+    6. [place]  Blue 2+    7. Meadow Fescue 2+    8. Perennial Rye 2+    9. Timothy 2+    10. Ragweed Mix Negative    11. Cocklebur Negative    12. Plantain,  English Negative    13. Baccharis Negative    14. Dog Fennel Negative    15. Russian Thistle Negative    16. Lamb's Quarters 2+    17. Sheep Sorrell 2+    18. Rough Pigweed 2+    20. Mugwort, Common Negative    21. Box, Elder Negative    22. Cedar, red Negative    23. Sweet Gum 2+    24. Pecan Pollen 2+    25. Pine Mix 2+    26. Walnut, Black Pollen 2+    27. Red Mulberry Negative    28. Ash Mix Negative    29. Birch Mix Negative    30. Beech American Negative    31. Cottonwood, [place] 2+    32. Hickory, White 3+    33. Maple Mix Negative    34. Oak, [place] Mix 2+    35. Sycamore Eastern Negative    36. Alternaria Alternata Negative    37. Cladosporium Herbarum Negative    38. Aspergillus Mix Negative    39. Penicillium Mix Negative    40. Bipolaris Sorokiniana (Helminthosporium) Negative    41. Drechslera Spicifera (Curvularia) Negative    42. Mucor Plumbeus Negative    43. Fusarium Moniliforme Negative    44. Aureobasidium Pullulans (pullulara) Negative    45. Rhizopus Oryzae Negative    46. Botrytis Cinera Negative    47.  Epicoccum Nigrum Negative    48. Phoma Betae Negative    49. Dust Mite Mix Negative    50. Cat Hair 10,000 BAU/ml 4+    51.  Dog Epithelia 2+    52. Mixed Feathers Negative    53. Horse Epithelia Negative    54. Cockroach, German Negative    55. Tobacco Leaf Negative             Intradermal - 01/30/24 1650     Time Antigen Placed 0430    Location Arm    Number of Test 7    Control 2+    Mold 1 Negative    Mold 2 Negative    Mold 3 Negative    Mold 4 Negative    Mite Mix 3+    Cockroach 3+             Food Adult Perc - 01/30/24 1600     Time Antigen Placed 0400    Allergen Manufacturer Floyd Hutchinson    Location Back    Number of allergen test 3  57. Banana Negative    58. Apple Negative    65. Pineapple Negative             Allergy testing results were read and interpreted by myself, documented by clinical staff.  Patient provided with copy of allergy testing along with avoidance measures when indicated.   Jonathon Neighbors, MD  [facility] 

## 2024-03-03 ENCOUNTER — Other Ambulatory Visit (HOSPITAL_BASED_OUTPATIENT_CLINIC_OR_DEPARTMENT_OTHER): Payer: Self-pay

## 2024-03-03 MED ORDER — LISDEXAMFETAMINE DIMESYLATE 50 MG PO CAPS
50.0000 mg | ORAL_CAPSULE | Freq: Every day | ORAL | 0 refills | Status: DC
  Filled 2024-03-03: qty 30, 30d supply, fill #0

## 2024-03-04 ENCOUNTER — Other Ambulatory Visit: Payer: Self-pay

## 2024-04-03 ENCOUNTER — Other Ambulatory Visit (HOSPITAL_BASED_OUTPATIENT_CLINIC_OR_DEPARTMENT_OTHER): Payer: Self-pay

## 2024-04-03 MED ORDER — LISDEXAMFETAMINE DIMESYLATE 50 MG PO CAPS
50.0000 mg | ORAL_CAPSULE | Freq: Every day | ORAL | 0 refills | Status: DC
  Filled 2024-04-03: qty 30, 30d supply, fill #0

## 2024-05-05 ENCOUNTER — Other Ambulatory Visit (HOSPITAL_BASED_OUTPATIENT_CLINIC_OR_DEPARTMENT_OTHER): Payer: Self-pay

## 2024-05-05 MED ORDER — LISDEXAMFETAMINE DIMESYLATE 50 MG PO CAPS
50.0000 mg | ORAL_CAPSULE | Freq: Every day | ORAL | 0 refills | Status: DC
  Filled 2024-05-07: qty 30, 30d supply, fill #0

## 2024-05-07 ENCOUNTER — Other Ambulatory Visit (HOSPITAL_BASED_OUTPATIENT_CLINIC_OR_DEPARTMENT_OTHER): Payer: Self-pay

## 2024-06-06 ENCOUNTER — Other Ambulatory Visit (HOSPITAL_BASED_OUTPATIENT_CLINIC_OR_DEPARTMENT_OTHER): Payer: Self-pay

## 2024-06-06 MED ORDER — LISDEXAMFETAMINE DIMESYLATE 50 MG PO CAPS
50.0000 mg | ORAL_CAPSULE | Freq: Every day | ORAL | 0 refills | Status: DC
  Filled 2024-06-06: qty 30, 30d supply, fill #0

## 2024-07-03 ENCOUNTER — Other Ambulatory Visit: Payer: Self-pay

## 2024-07-03 ENCOUNTER — Other Ambulatory Visit (HOSPITAL_BASED_OUTPATIENT_CLINIC_OR_DEPARTMENT_OTHER): Payer: Self-pay

## 2024-07-03 ENCOUNTER — Encounter: Payer: Self-pay | Admitting: Internal Medicine

## 2024-07-03 ENCOUNTER — Ambulatory Visit: Admitting: Internal Medicine

## 2024-07-03 VITALS — BP 112/64 | HR 84 | Ht 65.0 in | Wt 150.0 lb

## 2024-07-03 DIAGNOSIS — T7819XD Other adverse food reactions, not elsewhere classified, subsequent encounter: Secondary | ICD-10-CM | POA: Diagnosis not present

## 2024-07-03 DIAGNOSIS — R0609 Other forms of dyspnea: Secondary | ICD-10-CM

## 2024-07-03 DIAGNOSIS — J3089 Other allergic rhinitis: Secondary | ICD-10-CM | POA: Diagnosis not present

## 2024-07-03 DIAGNOSIS — J302 Other seasonal allergic rhinitis: Secondary | ICD-10-CM

## 2024-07-03 DIAGNOSIS — J4530 Mild persistent asthma, uncomplicated: Secondary | ICD-10-CM | POA: Diagnosis not present

## 2024-07-03 DIAGNOSIS — T7819XA Other adverse food reactions, not elsewhere classified, initial encounter: Secondary | ICD-10-CM

## 2024-07-03 MED ORDER — FLUTICASONE PROPIONATE 50 MCG/ACT NA SUSP
1.0000 | Freq: Every day | NASAL | 5 refills | Status: AC
  Filled 2024-07-03: qty 16, 31d supply, fill #0

## 2024-07-03 MED ORDER — ALBUTEROL SULFATE HFA 108 (90 BASE) MCG/ACT IN AERS
2.0000 | INHALATION_SPRAY | RESPIRATORY_TRACT | 1 refills | Status: AC | PRN
  Filled 2024-07-03: qty 6.7, 17d supply, fill #0

## 2024-07-03 MED ORDER — LORATADINE 10 MG PO TABS
10.0000 mg | ORAL_TABLET | Freq: Every day | ORAL | 5 refills | Status: AC
  Filled 2024-07-03: qty 30, 30d supply, fill #0

## 2024-07-03 MED ORDER — QVAR REDIHALER 40 MCG/ACT IN AERB
1.0000 | INHALATION_SPRAY | Freq: Two times a day (BID) | RESPIRATORY_TRACT | 5 refills | Status: AC
  Filled 2024-07-03: qty 10.6, 30d supply, fill #0
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
## 2024-07-03 NOTE — Progress Notes (Signed)
 FOLLOW UP Date of Service/Encounter:   07/03/2024  Subjective:  Courtney [name] (DOB: 04/16/1991) is a 33 y.o. female who [facility] in re-evaluation of the following: Seasonal allergic rhinitis, suspected mild asthma, pollen food allergy  syndrome History obtained from: chart review and patient.  For Review, LV was on 01/30/24  with Dr.Estell Dillinger seen for allergy  testing. See below for summary of history and diagnostics.   ----------------------------------------------------- Pertinent History/Diagnostics:  Seasonal allergic rhinitis Symptoms managed with loratadine  and Flonase . Only affected in spring. Congestion, watery/itchy eyes. - Allergy  testing 05/31/2024 revealed  positive to dust pollen, weed pollen, tree pollen, cat and dog.  Intradermal's positive to dust mite mix and cockroach. Pollen food allergy  syndrome Symptoms triggered by raw apples, pineapples, and bananas due to cross-reactivity with birch tree and ragweed pollen. Processed or cooked forms are tolerated. - Avoid raw apples, pineapples, and bananas Suspected Mild asthma Symptoms managed with Qvar  and albuterol  inhalers. Albuterol  provides delayed relief. Pulmonary function tests normal, but symptoms suggest mild asthma. Exacerbations during seasonal changes, wildfires and physical exertion. --------------------------------------------------- Today presents for follow-up. Discussed the use of AI scribe software for clinical note transcription with the patient, who gave verbal consent to proceed.  History of Present Illness Courtney [name] is a 33 year old female with asthma who presents with shortness of breath and chest tightness.  Dyspnea and chest tightness - Shortness of breath and chest tightness occur during activities such as waking up, moving around, driving, and working as an EMS -Reports that just talking makes her short of breath - Chest pain described as a 'hollow pain  inside chest' - Symptoms occur all of a sudden without associated symptoms - No wheezing present - Symptoms worsen with physical activity and are more pronounced in the late afternoon after a day of working EMS where she expresses being constantly on the go unable to sit down -Symptoms are not relieved at all by albuterol  which she is using regularly but feels she should probably use more often albeit cannot say that it helps - symptoms recover rapidly but then recur multiple times throughout the day  Asthma management and medication response - Uses albuterol  inhaler, primarily in the evening when extremely tired and short of breath - Albuterol  inhaler does not alleviate symptoms - Uses Qvar  inhaler twice daily - Takes allergy  medication regularly  Dizziness and orthostatic symptoms - Experiences dizziness upon standing, requiring her to hold onto walls or sit down to prevent falling - Climbing stairs is challenging due to dizziness, necessitating frequent stops  Palpitations and cardiac evaluation - History of chest palpitations - Underwent heart monitoring in 2021, which showed occasional PVCs without significant findings  Medication adherence challenges - ADHD sometimes affects her ability to remember to take medications   All medications reviewed by clinical staff and updated in chart. No new pertinent medical or surgical history except as noted in HPI.  ROS: All others negative except as noted per HPI.   Objective:  BP 112/64 (BP Location: Right Arm, Patient Position: Sitting)   Pulse 84   Ht 5' 5 (1.651 m)   Wt 150 lb (68 kg)   SpO2 100%   BMI 24.96 kg/m  Body mass index is 24.96 kg/m. Physical Exam: General Appearance:  Alert, cooperative, no distress, appears stated age  Head:  Normocephalic, without obvious abnormality, atraumatic  Eyes:  Conjunctiva clear, EOM's intact  Ears EACs normal bilaterally and normal TMs bilaterally  Nose:  Nares normal, hypertrophic  turbinates, normal mucosa, and no visible anterior polyps  Throat: Lips, tongue normal; teeth and gums normal, normal posterior oropharynx  Neck: Supple, symmetrical  Lungs:   clear to auscultation bilaterally, Respirations unlabored, no coughing  Heart:  regular rate and rhythm and no murmur, Appears well perfused  Extremities: No edema  Skin: Skin color, texture, turgor normal and no rashes or lesions on visualized portions of skin  Neurologic: No gross deficits   Labs:  Lab Orders  No laboratory test(s) ordered today    Spirometry:  Tracings reviewed. Her effort: Good reproducible efforts. FVC: 3.31L pre, 3.35 L post FEV1: 3.05L, 107% predicted pre, 3.07L, 107% predicted post FEV1/FVC ratio: 0.92 pre, 0.92 post Interpretation: Spirometry consistent with normal pattern.  Please see scanned spirometry results for details.  Assessment/Plan   Patient with short burst of dyspnea throughout the day associated with things such as talking as well as exertion.  Not responding to albuterol .  Normal pre and post spirometry.  Normal heart and lung exam.  Reports increased demand at work.  Reports prior cardiac evaluation including Holter monitor without significant findings.  Reports symptoms concerning for orthostatic hypotension.  Given history exam and spirometry findings today, do not suspect asthma as the cause of her acute symptoms of shortness of breath.  Agree with cardiac evaluation.  Quick recovery from symptoms reassuring. Query anxiety component.  Seasonal allergic rhinitis Symptoms managed with current medications. Only affected in spring. Congestion, watery/itchy eyes. - Continue loratadine  10 mg daily as needed and fluticasone  nasal spray.  1 spray in each nostril twice daily.  Best if used daily. - Allergy  testing 01/30/24 revealed  positive to dust pollen, weed pollen, tree pollen, cat and dog.  Intradermal's positive to dust mite mix and cockroach. - allergen  avoidance. -Consider allergy  injections to reduce lifetime symptoms and need for medications by teaching your immune system to become tolerant of the environmental allergens you are allergic to  Pollen food allergy  syndrome Symptoms triggered by raw apples, pineapples, and bananas due to cross-reactivity with birch tree and ragweed pollen. Processed or cooked forms are tolerated. - Avoid raw apples, pineapples, and bananas.  - Monitor for symptoms with other pitted fruits or cross-reactive fruits based on chart provided below.. -allergy  testing 01/30/24 was negative to apples, pineapples and bananas.  Send continue suspects pollen food allergy  syndrome.  Advised to avoid raw forms of these foods and may see improvement with allergy  injections.  Suspected Mild asthma Symptoms managed with Qvar  and albuterol  inhalers. Albuterol  provides delayed relief. Pulmonary function tests normal, but symptoms suggest mild asthma. Exacerbations during seasonal changes, wildfires and physical exertion. - Continue Qvar  inhaler one puff twice daily. - During respiratory illnesses/increase in symptoms: Increase Qvar  to two puffs twice dailyfor 1-2 weeks or until symptoms resolve - Use albuterol  inhaler as needed and track usage. 2 puffs every 4 hours as needed.  Dyspnea with exertion Lightheadedness with change in position Dyspnea with talking, etc - agree with Cardiology evaluation - pre and post spirometry today (07/03/24) is normal with NO improvement following bronchodilator - symptoms not responsive to albuterol  - do not suspect asthma as cause of symptoms    Follow up : 6 months, sooner if needed.   It was a pleasure seeing you again in clinic today! Thank you for allowing me to participate in your care.  Other: none  Rocky Endow, MD  [facility] 

## 2024-07-07 ENCOUNTER — Other Ambulatory Visit (HOSPITAL_BASED_OUTPATIENT_CLINIC_OR_DEPARTMENT_OTHER): Payer: Self-pay

## 2024-07-07 MED ORDER — LISDEXAMFETAMINE DIMESYLATE 50 MG PO CAPS
50.0000 mg | ORAL_CAPSULE | Freq: Every day | ORAL | 0 refills | Status: DC
  Filled 2024-07-07: qty 30, 30d supply, fill #0

## 2024-07-08 ENCOUNTER — Other Ambulatory Visit: Payer: Self-pay

## 2024-07-29 ENCOUNTER — Other Ambulatory Visit (HOSPITAL_BASED_OUTPATIENT_CLINIC_OR_DEPARTMENT_OTHER): Payer: Self-pay

## 2024-07-29 MED ORDER — VITAMIN D 1.25 MG (50000 UT) PO CAPS
1.2500 mg | ORAL_CAPSULE | ORAL | 0 refills | Status: AC
  Filled 2024-07-29: qty 12, 84d supply, fill #0

## 2024-08-08 ENCOUNTER — Other Ambulatory Visit (HOSPITAL_BASED_OUTPATIENT_CLINIC_OR_DEPARTMENT_OTHER): Payer: Self-pay

## 2024-08-08 MED ORDER — LISDEXAMFETAMINE DIMESYLATE 50 MG PO CAPS
50.0000 mg | ORAL_CAPSULE | Freq: Every day | ORAL | 0 refills | Status: DC
  Filled 2024-08-08: qty 30, 30d supply, fill #0

## 2024-08-20 ENCOUNTER — Ambulatory Visit: Attending: Internal Medicine | Admitting: Internal Medicine

## 2024-08-20 ENCOUNTER — Encounter: Payer: Self-pay | Admitting: Internal Medicine

## 2024-08-20 VITALS — BP 121/77 | HR 88 | Ht 65.0 in | Wt 151.6 lb

## 2024-08-20 DIAGNOSIS — R0609 Other forms of dyspnea: Secondary | ICD-10-CM | POA: Diagnosis not present

## 2024-08-20 DIAGNOSIS — R0602 Shortness of breath: Secondary | ICD-10-CM

## 2024-08-20 DIAGNOSIS — R42 Dizziness and giddiness: Secondary | ICD-10-CM

## 2024-08-20 NOTE — Progress Notes (Signed)
 Cardiology Office Note:  .   Date:  08/20/2024  ID:  Courtney [name], DOB [birth year], MRN [id number] PCP: Mickie Kaufman, MD  [facility] Providers Cardiologist:  Soyla DELENA Merck, MD    History of Present Illness: .   Courtney [name] is a 33 y.o. female.  Discussed the use of AI scribe software for clinical note transcription with the patient, who gave verbal consent to proceed.  History of Present Illness Courtney [name] is a 33 year old female with asthma and ADHD who presents with dyspnea on exertion.  She experiences significant dyspnea on exertion, worsening since March 2025, with minimal activity such as getting in and out of her truck or walking up a single flight of stairs. This is often accompanied by dizziness, lightheadedness, and a sensation of impending fainting. Her legs feel heavy, and her arms ache with minimal exertion. Albuterol  does not relieve her shortness of breath.  She has a history of chest pain, palpitations, and shortness of breath with activity. An echocardiogram in 2021 was normal , and a recent EKG showed a nonspecific abnormality. She experiences episodes of presyncope, particularly when standing up from a lying position, along with dizziness and a racing heart. She has discussed the possibility of POTS with her providers.  She reports significant fatigue and episodes of excessive sleep. Her primary care physician recently checked her vitamin D  and ferritin levels; her vitamin D  was low, and she has started supplementation. Her ferritin and iron levels were normal. She takes Vyvanse  for ADHD, which she finds helpful and does not believe is contributing to her symptoms. She does not smoke or use recreational drugs.    ROS: negative except per HPI above.  Studies Reviewed: SABRA   EKG Interpretation Date/Time:  Wednesday August 20 2024 15:01:21 EST Ventricular Rate:  84 PR Interval:  154 QRS Duration:  80 QT Interval:  354 QTC Calculation: 418 R  Axis:   79  Text Interpretation: Normal sinus rhythm Nonspecific T wave abnormality Confirmed by Merck Soyla ([id number]) on 08/20/2024 3:38:19 PM    Results LABS Vitamin D : low (07/2024) Ferritin: normal (07/2024) Thyroid  function tests: normal (05/2024)  DIAGNOSTIC EKG: nonspecific abnormality (08/20/2024) Echocardiogram: normal (2021) Risk Assessment/Calculations:       Physical Exam:   VS:  BP 121/77   Pulse 88   Ht 5' 5 (1.651 m)   Wt 151 lb 9.6 oz (68.8 kg)   SpO2 100%   BMI 25.23 kg/m    Wt Readings from Last 3 Encounters:  08/20/24 151 lb 9.6 oz (68.8 kg)  07/03/24 150 lb (68 kg)  01/25/24 152 lb (68.9 kg)     Physical Exam GENERAL: Alert, cooperative, well developed, no acute distress. HEENT: Normocephalic, normal oropharynx, moist mucous membranes. CHEST: Clear to auscultation bilaterally, no wheezes, rhonchi, or crackles. CARDIOVASCULAR: Normal heart rate and rhythm, S1 and S2 normal without murmurs. ABDOMEN: Soft, non-tender, non-distended, without organomegaly, normal bowel sounds. EXTREMITIES: No cyanosis or edema. NEUROLOGICAL: Cranial nerves grossly intact, moves all extremities without gross motor or sensory deficit.   ASSESSMENT AND PLAN: .    Assessment and Plan Assessment & Plan Dyspnea on exertion with associated dizziness and presyncope Chronic dyspnea on exertion with dizziness and presyncope since 2021, exacerbated recently. EKG shows nonspecific abnormality, previous echocardiogram normal. Differential includes POTS, cardiac causes considered. - Ordered echocardiogram with bubble study. - Ordered stress echocardiogram. - Recommended medium compression socks (10-15 mmHg) daily. - Advised adding electrolyte drinks or salt to  fluids when symptomatic. - Scheduled follow-up to review test results.  Asthma, uncomplicated Asthma diagnosed in April, symptoms not resolved with albuterol , not attributed to asthma. - Continue asthma management  with Qvar  as needed.  Vitamin D  deficiency Vitamin D  level slightly low, supplementation started one month ago. - Continue vitamin D  supplementation as prescribed.  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  Unexplained hair loss and bruising Reported unexplained hair loss and bruising, thyroid  function normal, no clear etiology. - Monitor symptoms and consider nutritional assessment.  Loss of appetite and nausea Intermittent loss of appetite and nausea, possibly related to dietary habits or other conditions. - Monitor symptoms and consider dietary modifications.   Informed Consent   Shared Decision Making/Informed Consent The risks [chest pain, shortness of breath, cardiac arrhythmias, dizziness, blood pressure fluctuations, myocardial infarction, stroke/transient ischemic attack, and life-threatening complications (estimated to be 1 in 10,000)], benefits (risk stratification, diagnosing coronary artery disease, treatment guidance) and alternatives of a stress or dobutamine stress echocardiogram were discussed in detail with Ms. Brocks and she agrees to proceed.         Soyla Merck, MD, FACC

## 2024-08-20 NOTE — Patient Instructions (Addendum)
 Medication Instructions:  No Changes *If you need a refill on your cardiac medications before your next appointment, please call your pharmacy*  Lab Work: None  Testing/Procedures: Your physician has requested that you have an echocardiogram Bubble Study. Echocardiography is a painless test that uses sound waves to create images of your heart. It provides your doctor with information about the size and shape of your heart and how well your heart's chambers and valves are working. This procedure takes approximately one hour. There are no restrictions for this procedure. Please do NOT wear cologne, perfume, aftershave, or lotions (deodorant is allowed). Please arrive 15 minutes prior to your appointment time.  Please note: We ask at that you not bring children with you during ultrasound (echo/ vascular) testing. Due to room size and safety concerns, children are not allowed in the ultrasound rooms during exams. Our front office staff cannot provide observation of children in our lobby area while testing is being conducted. An adult accompanying a patient to their appointment will only be allowed in the ultrasound room at the discretion of the ultrasound technician under special circumstances. We apologize for any inconvenience.   Your physician has requested that you have a Treadmill stress echocardiogram. For further information please visit [email or web address] Please follow instruction sheet as given.  Please note: We ask at that you not bring children with you during ultrasound (echo/ vascular) testing. Due to room size and safety concerns, children are not allowed in the ultrasound rooms during exams. Our front office staff cannot provide observation of children in our lobby area while testing is being conducted. An adult accompanying a patient to their appointment will only be allowed in the ultrasound room at the discretion of the ultrasound technician under special circumstances. We apologize  for any inconvenience.   Follow-Up: At [facility], you and your health needs are our priority.  As part of our continuing mission to provide you with exceptional heart care, our providers are all part of one team.  This team includes your primary Cardiologist (physician) and Advanced Practice Providers or APPs (Physician Assistants and Nurse Practitioners) who all work together to provide you with the care you need, when you need it.  Your next appointment:   2 month(s)  Provider:   One of our Advanced Practice Providers (APPs): Courtney Beauvais, NP  Courtney Ford, PA-C Courtney Braver, NP  Courtney Hails, PA-C      Courtney Dunn, PA-C  Courtney Ferrier, PA-C Courtney Springs, NP Courtney West, NP Courtney Goodrich, PA-C  Courtney Haley, PA-C    Courtney Johnson, PA-C    Other Instructions *Dr. Loni recommends that you wear Compression socks (knee length); mid grade compression level (around 15-20 mmHg); please wear during the day and remove at bedtime.   *Also, increase the amount of Electrolyte Drinks if you are having symptoms.

## 2024-09-09 ENCOUNTER — Other Ambulatory Visit (HOSPITAL_BASED_OUTPATIENT_CLINIC_OR_DEPARTMENT_OTHER): Payer: Self-pay

## 2024-09-09 MED ORDER — LISDEXAMFETAMINE DIMESYLATE 50 MG PO CAPS
50.0000 mg | ORAL_CAPSULE | Freq: Every day | ORAL | 0 refills | Status: DC
  Filled 2024-09-09 – 2024-09-10 (×2): qty 30, 30d supply, fill #0

## 2024-09-10 ENCOUNTER — Other Ambulatory Visit (HOSPITAL_BASED_OUTPATIENT_CLINIC_OR_DEPARTMENT_OTHER): Payer: Self-pay

## 2024-09-10 MED ORDER — AMOXICILLIN-POT CLAVULANATE 400-57 MG/5ML PO SUSR
10.0000 mL | Freq: Two times a day (BID) | ORAL | 0 refills | Status: AC
  Filled 2024-09-10: qty 200, 10d supply, fill #0

## 2024-09-10 MED ORDER — PSEUDOEPH-BROMPHEN-DM 30-2-10 MG/5ML PO SYRP
5.0000 mL | ORAL_SOLUTION | Freq: Four times a day (QID) | ORAL | 0 refills | Status: AC | PRN
  Filled 2024-09-10: qty 100, 5d supply, fill #0

## 2024-09-19 ENCOUNTER — Other Ambulatory Visit (HOSPITAL_COMMUNITY)

## 2024-09-19 ENCOUNTER — Ambulatory Visit (HOSPITAL_COMMUNITY)

## 2024-09-24 ENCOUNTER — Ambulatory Visit (HOSPITAL_COMMUNITY)
Admission: RE | Admit: 2024-09-24 | Discharge: 2024-09-24 | Disposition: A | Discharge status: Home / Self Care | Source: Ambulatory Visit | Attending: Cardiovascular Disease | Admitting: Cardiovascular Disease

## 2024-09-24 DIAGNOSIS — R0609 Other forms of dyspnea: Secondary | ICD-10-CM | POA: Diagnosis present

## 2024-09-25 ENCOUNTER — Ambulatory Visit: Payer: Self-pay | Admitting: Internal Medicine

## 2024-09-26 ENCOUNTER — Telehealth (HOSPITAL_COMMUNITY): Payer: Self-pay | Admitting: *Deleted

## 2024-09-26 LAB — ECHOCARDIOGRAM COMPLETE BUBBLE STUDY
Area-P 1/2: 3.34 cm2
S' Lateral: 2.86 cm

## 2024-09-26 NOTE — Telephone Encounter (Signed)
 Left message on voicemail per DPR in reference to upcoming appointment scheduled on 10/03/2024 at 2:45 with detailed instructions given per Stress Test Requisition Sheet for the test. LM to arrive 30 minutes early, and that it is imperative to arrive on time for appointment to keep from having the test rescheduled. If you need to cancel or reschedule your appointment, please call the office within 24 hours of your appointment. Failure to do so may result in a cancellation of your appointment, and a $50 no show fee. Phone number given for call back for any questions.

## 2024-10-03 ENCOUNTER — Ambulatory Visit (HOSPITAL_COMMUNITY)
Admission: RE | Admit: 2024-10-03 | Discharge: 2024-10-03 | Disposition: A | Discharge status: Home / Self Care | Source: Ambulatory Visit | Attending: Internal Medicine | Admitting: Internal Medicine

## 2024-10-03 DIAGNOSIS — R0609 Other forms of dyspnea: Secondary | ICD-10-CM | POA: Insufficient documentation

## 2024-10-03 MED ORDER — PERFLUTREN LIPID MICROSPHERE
1.0000 mL | INTRAVENOUS | Status: DC | PRN
  Administered 2024-10-03 (×3): 2 mL via INTRAVENOUS

## 2024-10-16 ENCOUNTER — Other Ambulatory Visit (HOSPITAL_BASED_OUTPATIENT_CLINIC_OR_DEPARTMENT_OTHER): Payer: Self-pay

## 2024-10-16 MED ORDER — LISDEXAMFETAMINE DIMESYLATE 50 MG PO CAPS
50.0000 mg | ORAL_CAPSULE | Freq: Every day | ORAL | 0 refills | Status: AC
  Filled 2024-10-16: qty 30, 30d supply, fill #0

## 2024-10-22 ENCOUNTER — Other Ambulatory Visit (HOSPITAL_BASED_OUTPATIENT_CLINIC_OR_DEPARTMENT_OTHER): Payer: Self-pay

## 2024-10-31 ENCOUNTER — Ambulatory Visit

## 2024-10-31 ENCOUNTER — Ambulatory Visit: Attending: Nurse Practitioner | Admitting: Nurse Practitioner

## 2024-10-31 ENCOUNTER — Ambulatory Visit
Admission: RE | Admit: 2024-10-31 | Discharge: 2024-10-31 | Disposition: A | Source: Ambulatory Visit | Attending: Nurse Practitioner | Admitting: Nurse Practitioner

## 2024-10-31 ENCOUNTER — Encounter: Payer: Self-pay | Admitting: Nurse Practitioner

## 2024-10-31 VITALS — BP 108/70 | HR 90 | Ht 65.5 in | Wt 147.0 lb

## 2024-10-31 DIAGNOSIS — R55 Syncope and collapse: Secondary | ICD-10-CM | POA: Diagnosis not present

## 2024-10-31 DIAGNOSIS — R42 Dizziness and giddiness: Secondary | ICD-10-CM

## 2024-10-31 DIAGNOSIS — R0609 Other forms of dyspnea: Secondary | ICD-10-CM

## 2024-10-31 DIAGNOSIS — R002 Palpitations: Secondary | ICD-10-CM

## 2024-10-31 DIAGNOSIS — R0602 Shortness of breath: Secondary | ICD-10-CM | POA: Diagnosis not present

## 2024-10-31 NOTE — Progress Notes (Unsigned)
Enrolled patient for a 14 day Zio XT monitor to be mailed to patients home  Courtney [name] to read

## 2024-10-31 NOTE — Progress Notes (Unsigned)
 "  Office Visit    Patient Name: Courtney [name] Date of Encounter: 10/31/2024  Primary Care Provider:  Satsangi, Aparna, MD Primary Cardiologist:  Soyla DELENA Merck, MD  Chief Complaint    34 year old female with a history of dyspnea on exertion, asthma, ADHD, anemia, and IBS who presents for follow-up related to dyspnea on exertion.  Past Medical History    Past Medical History:  Diagnosis Date   Acanthosis nigricans    Allergy     Anemia    no current med.   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   Complication of anesthesia    states was told she slept longer in recovery than most people   Heart murmur    pt states mom told her she has heart murmur   Hives    on and off   IBS (irritable bowel syndrome) 06/22/2017   Irritable bowel syndrome with constipation    alternates constipation with diarrhea   Low vitamin D  level    Osteoarthritis of right knee 07/2015   Plica syndrome of right knee 07/2015   Past Surgical History:  Procedure Laterality Date   KNEE ARTHROSCOPY WITH LATERAL RELEASE Right 07/29/2015   Procedure: RIGHT KNEE ARTHROSCOPY CHONDROPLASTY WITH LATERAL RELEASE;  Surgeon: Toribio JULIANNA Chancy, MD;  Location: [facility];  Service: Orthopedics;  Laterality: Right;   LAPAROSCOPY N/A 04/30/2014   Procedure: LAPAROSCOPY DIAGNOSTIC;  Surgeon: Hargis Paradise, MD;  Location: [facility];  Service: Gynecology;  Laterality: N/A;   MENISCUS REPAIR  2023   SYNOVECTOMY Right 07/29/2015   Procedure: LIMITED SYNOVECTOMY PLICA ;  Surgeon: Toribio JULIANNA Chancy, MD;  Location: [facility];  Service: Orthopedics;  Laterality: Right;    Allergies  Allergies[1]   Labs/Other Studies Reviewed    The following studies were reviewed today:  Cardiac Studies & Procedures   ______________________________________________________________________________________________   STRESS TESTS  ECHOCARDIOGRAM STRESS TEST 10/03/2024  Narrative EXERCISE STRESS ECHO  REPORT   --------------------------------------------------------------------------------  Patient Name:   Courtney [name]    Date of Exam: 10/03/2024 Medical Rec #:  [id number]     Height:       65.0 in Accession #:    [id number]    Weight:       151.6 lb Date of Birth:  25-Dec-1990    BSA:          1.758 m Patient Age:    33 years      BP:           124/87 mmHg Patient Gender: F             HR:           89 bpm. Exam Location:  [address]  Procedure: Stress Echo, Intracardiac Opacification Agent and Saline Contrast Bubble Study  Indications:    R06.09 Dyspnea  History:        Patient has prior history of Echocardiogram examinations, most recent 09/24/2024.  Sonographer:    Heather Hawks RDCS Referring Phys: ROSETTE MANNAN  IMPRESSIONS   1. Late shunting of bubbles during saline agitation study. This is suggestive of an intrapulmonary shunt. Study was negative for intracardiac shunt. 2. This is a negative stress echocardiogram for ischemia. 3. This is a low risk study.  FINDINGS  Exam Protocol: The patient exercised on a treadmill according to a Bruce protocol. Definity  contrast agent was given IV to delineate the left ventricular endocardial borders.   Patient Performance: The patient exercised for 12 minutes  achieving 13.7 METS. The maximum stage achieved was IV of the Bruce protocol. The baseline heart rate was 89 bpm. The heart rate at peak stress was 190 bpm. The target heart rate was calculated to be 160 bpm. The percentage of maximum predicted heart rate achieved was 100.6 %. The baseline blood pressure was 124/87 mmHg. The blood pressure at peak stress was 134/55 mmHg. The blood pressure response was normal. The patient developed leg fatigue, fatigue, shortness of breath and chest pain during the stress exam. The symptoms resolved with rest. The patient's functional capacity was excellent.  EKG: Resting EKG showed normal sinus rhythm with nonspecific ST-T wave changes.  The patient developed occasional premature ventricular contractions during exercise. The stress EKG was not interpretable due to baseline abnormalities.   2D Echo Findings: The baseline ejection fraction was 55-60%. The peak ejection fraction at stress was 70%. Baseline regional wall motion abnormalities were not present. There were no stress-induced wall motion abnormalities. This is a negative stress echocardiogram for ischemia.   Georganna Archer Electronically signed on 10/03/2024 at 6:01:29 PM     Final   ECHOCARDIOGRAM  ECHOCARDIOGRAM COMPLETE BUBBLE STUDY 09/24/2024  Narrative ECHOCARDIOGRAM REPORT    Patient Name:   Courtney [name]    Date of Exam: 09/24/2024 Medical Rec #:  [id number]     Height:       65.0 in Accession #:    [id number]    Weight:       151.6 lb Date of Birth:  1991-08-06    BSA:          1.758 m Patient Age:    33 years      BP:           114/83 mmHg Patient Gender: F             HR:           87 bpm. Exam Location:  [address]  Procedure: 2D Echo, 3D Echo, Strain Analysis and Saline Contrast Bubble Study (Both Spectral and Color Flow Doppler were utilized during procedure).  Indications:     R06.09 DOE  History:         Patient has prior history of Echocardiogram examinations, most recent 07/12/2020. Signs/Symptoms:Chest Pain and Shortness of Breath. Asthma. Dizziness. Lightheadedness. Palpitations.  Sonographer:     Jon Hacker RCS Referring Phys:  [id number] SOYLA DELENA MERCK Diagnosing Phys: Soyla Merck MD  IMPRESSIONS   1. Left ventricular ejection fraction, by estimation, is 55 to 60%. Left ventricular ejection fraction by 3D volume is 58 %. The left ventricle has normal function. The left ventricle has no regional wall motion abnormalities. Left ventricular diastolic parameters were normal. The average left ventricular global longitudinal strain is -25.3 %. The global longitudinal strain is normal. 2. Right ventricular systolic  function is normal. The right ventricular size is normal. There is normal pulmonary artery systolic pressure. The estimated right ventricular systolic pressure is 19.5 mmHg. 3. The mitral valve is normal in structure. Trivial mitral valve regurgitation. No evidence of mitral stenosis. 4. The aortic valve is tricuspid. Aortic valve regurgitation is not visualized. No aortic stenosis is present. 5. The inferior vena cava is normal in size with greater than 50% respiratory variability, suggesting right atrial pressure of 3 mmHg. 6. Agitated saline contrast bubble study was negative, with no evidence of any interatrial shunt. Scant late bubbles seen, may indicate a very tiny intrapulmonary shunt.  FINDINGS Left Ventricle: Left ventricular ejection fraction, by estimation,  is 55 to 60%. Left ventricular ejection fraction by 3D volume is 58 %. The left ventricle has normal function. The left ventricle has no regional wall motion abnormalities. The average left ventricular global longitudinal strain is -25.3 %. Strain was performed and the global longitudinal strain is normal. The left ventricular internal cavity size was normal in size. There is no left ventricular hypertrophy. Left ventricular diastolic parameters were normal.  Right Ventricle: The right ventricular size is normal. No increase in right ventricular wall thickness. Right ventricular systolic function is normal. There is normal pulmonary artery systolic pressure. The tricuspid regurgitant velocity is 2.03 m/s, and with an assumed right atrial pressure of 3 mmHg, the estimated right ventricular systolic pressure is 19.5 mmHg.  Left Atrium: Left atrial size was normal in size.  Right Atrium: Right atrial size was normal in size.  Pericardium: There is no evidence of pericardial effusion.  Mitral Valve: The mitral valve is normal in structure. Trivial mitral valve regurgitation. No evidence of mitral valve stenosis.  Tricuspid Valve: The  tricuspid valve is normal in structure. Tricuspid valve regurgitation is mild . No evidence of tricuspid stenosis.  Aortic Valve: The aortic valve is tricuspid. Aortic valve regurgitation is not visualized. No aortic stenosis is present.  Pulmonic Valve: The pulmonic valve was normal in structure. Pulmonic valve regurgitation is trivial. No evidence of pulmonic stenosis.  Aorta: The aortic root is normal in size and structure.  Venous: The inferior vena cava is normal in size with greater than 50% respiratory variability, suggesting right atrial pressure of 3 mmHg.  IAS/Shunts: No atrial level shunt detected by color flow Doppler. Agitated saline contrast was given intravenously to evaluate for intracardiac shunting. Agitated saline contrast bubble study was negative, with no evidence of any interatrial shunt.  Additional Comments: 3D was performed not requiring image post processing on an independent workstation and was normal.   LEFT VENTRICLE PLAX 2D LVIDd:         4.17 cm         Diastology LVIDs:         2.86 cm         LV e' medial:    12.50 cm/s LV PW:         0.76 cm         LV E/e' medial:  7.1 LV IVS:        0.72 cm         LV e' lateral:   23.90 cm/s LVOT diam:     2.00 cm         LV E/e' lateral: 3.7 LV SV:         63 LV SV Index:   36              2D Longitudinal LVOT Area:     3.14 cm        Strain 2D Strain GLS   -25.9 % (A4C): 2D Strain GLS   -25.7 % (A3C): 2D Strain GLS   -24.2 % (A2C): 2D Strain GLS   -25.3 % Avg:  3D Volume EF LV 3D EF:    Left ventricul ar ejection fraction by 3D volume is 58 %.  3D Volume EF: 3D EF:        58 % LV EDV:       136 ml LV ESV:       57 ml LV SV:        79 ml  RIGHT VENTRICLE RV Basal  diam:  2.97 cm RV S prime:     15.20 cm/s TAPSE (M-mode): 2.1 cm  LEFT ATRIUM             Index        RIGHT ATRIUM          Index LA diam:        3.10 cm 1.76 cm/m   RA Area:     8.14 cm LA Vol (A2C):   26.6 ml 15.13 ml/m  RA  Volume:   14.20 ml 8.08 ml/m LA Vol (A4C):   21.3 ml 12.11 ml/m LA Biplane Vol: 24.8 ml 14.10 ml/m AORTIC VALVE LVOT Vmax:   110.00 cm/s LVOT Vmean:  74.600 cm/s LVOT VTI:    0.201 m  AORTA Ao Root diam: 2.50 cm Ao Asc diam:  2.40 cm  MITRAL VALVE               TRICUSPID VALVE MV Area (PHT): 3.34 cm    TR Peak grad:   16.5 mmHg MV Decel Time: 227 msec    TR Vmax:        203.00 cm/s MV E velocity: 89.10 cm/s MV A velocity: 67.10 cm/s  SHUNTS MV E/A ratio:  1.33        Systemic VTI:  0.20 m Systemic Diam: 2.00 cm  Soyla Merck MD Electronically signed by Soyla Merck MD Signature Date/Time: 09/25/2024/6:32:59 PM    Final (Updated)    MONITORS  LONG TERM MONITOR (3-14 DAYS) 06/15/2020  Narrative Indication: tachycardia  Minimum HR (bpm): 52 Maximum HR (bpm): 187  Sinus rates >100 bpm: 21% of monitor duration  Supraventricular Ectopy: <1% SVT: none  Ventricular Ectopy: <1% NSVT: none Ventricular Tachycardia: none  Pauses: none AV block: none  Atrial fibrillation: none  Diary events: Frequent symptoms of SOB, CP, Dizzy, Pounding, Lightheaded, feeling like crap!, Pain with deep breathing, fluttering or racing all associated with sinus rhythm and sinus tachycardia.  Impression Infrequent ectopic beats. Diary events associated with sinus rhythm and sinus tachycardia.       ______________________________________________________________________________________________     Recent Labs: No results found for requested labs within last 365 days.  Recent Lipid Panel No results found for: CHOL, TRIG, HDL, CHOLHDL, VLDL, LDLCALC, LDLDIRECT  History of Present Illness    34 year old female with the above past medical history including dyspnea on exertion, asthma, ADHD, anemia, and IBS.  She has a history of chronic dyspnea on exertion, atypical chest pain, dizziness with presyncope, and palpitations dating back to 2021.  Echocardiogram in  2021 was normal.  He.  She was referred to cardiology in the setting of progressive dyspnea on exertion.  She was last seen in the office on 08/20/2024 and reported a greater than 41-month history of progressive dyspnea on exertion.  Echocardiogram in 09/2024 showed EF 55 to 60%, normal LV function, no RWMA, normal RV systolic function, no significant valvular abnormalities, bubble study negative for an interatrial shunt.  Stress echo in 10/2024 was negative for ischemia, there was evidence of late shunting of bubbles during saline agitation, suggestive of intrapulmonary shunt.  She declined pulmonology referral.  She presents today for follow-up.  Since her last visit she has been stable overall from a cardiac standpoint.  She continues to note shortness of breath with minimal exertion, intermittent chest tightness with exertion, occasional dizziness, presyncope, tachypalpitations.  She denies any syncope.  Euvolemic and well compensated on exam.  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  She has a very high stress job.  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  She is under a large amount of stress.  She also takes Vyvanse  for ADHD.  She does not drink much water throughout the day.  She is currently experiencing upper respiratory.  Overall her symptoms are unchanged. Suspect symptoms are multifactorial in the setting of stress, dehydration, medication.  Workup to date reassuring. Will check 14-day ZIO monitor.  Will check chest x-ray.  She is agreeable to pulmonology referral, will place today.  Follow-up in 2 to 3 months with Dr. Acharya.  Encouraged adequate hydration.    Dyspnea on exertion: Palpitations/dizziness/presyncope: Disposition:  Home Medications    Current Outpatient Medications  Medication Sig Dispense Refill   albuterol  (VENTOLIN  HFA) 108 (90 Base) MCG/ACT inhaler Inhale 2 puffs into the lungs every 4 (four) hours as needed. 6.7 g 1    beclomethasone (QVAR  REDIHALER) 40 MCG/ACT inhaler Inhale 1 puff into the lungs 2 (two) times daily. 10.6 g 5   Cholecalciferol  (VITAMIN D ) 1.25 MG (50000 UT) CAPS Take 1 capsule (1.25 mg) by mouth once a week. 12 capsule 0   fluticasone  (FLONASE  ALLERGY  RELIEF) 50 MCG/ACT nasal spray Place 1 spray into both nostrils daily. 16 g 5   levonorgestrel (MIRENA) 20 MCG/24HR IUD 1 each by Intrauterine route once.     lisdexamfetamine  (VYVANSE ) 50 MG capsule Take 1 capsule (50 mg total) by mouth daily. 30 capsule 0   loratadine  (CLARITIN ) 10 MG tablet Take 1 tablet (10 mg total) by mouth daily. 30 tablet 5   amoxicillin -clavulanate (AUGMENTIN ) 400-57 MG/5ML suspension Take 10 mLs by mouth every 12 (twelve) hours for 7 days. Discard remainder (Patient not taking: Reported on 10/31/2024) 200 mL 0   brompheniramine-pseudoephedrine-DM 30-2-10 MG/5ML syrup Take 5 mLs by mouth every 6 (six) hours as needed. (Patient not taking: Reported on 10/31/2024) 100 mL 0   No current facility-administered medications for this visit.     Review of Systems    ***.  All other systems reviewed and are otherwise negative except as noted above.    Physical Exam    VS:  BP 108/70 (BP Location: Left Arm, Patient Position: Sitting, Cuff Size: Normal)   Pulse 90   Ht 5' 5.5 (1.664 m)   Wt 147 lb (66.7 kg)   SpO2 98%   BMI 24.09 kg/m  , GEN: Well nourished, well developed, in no acute distress. HEENT: normal. Neck: Supple, no JVD, carotid bruits, or masses. Cardiac: RRR, no murmurs, rubs, or gallops. No clubbing, cyanosis, edema.  Radials/DP/PT 2+ and equal bilaterally.  Respiratory:  Respirations regular and unlabored, clear to auscultation bilaterally. GI: Soft, nontender, nondistended, BS + x 4. MS: no deformity or atrophy. Skin: warm and dry, no rash. Neuro:  Strength and sensation are intact. Psych: Normal affect.  Accessory Clinical Findings    ECG personally reviewed by me today -    - no acute changes.    Lab Results  Component Value Date   WBC 8.1 11/23/2016   HGB 12.8 11/23/2016   HCT 37.7 11/23/2016   MCV 89.1 11/23/2016   PLT 266.0 11/23/2016   Lab Results  Component Value Date   CREATININE 0.77 11/23/2016   BUN 16 11/23/2016   NA 139 11/23/2016   K 4.4 11/23/2016   CL 104 11/23/2016   CO2 30 11/23/2016   Lab Results  Component Value Date   ALT 16  11/23/2016   AST 15 11/23/2016   ALKPHOS 77 11/23/2016   BILITOT 0.4 11/23/2016   No results found for: CHOL, HDL, LDLCALC, LDLDIRECT, TRIG, CHOLHDL  No results found for: HGBA1C  Assessment & Plan    1.  ***      Damien JAYSON Braver, NP 10/31/2024, 8:51 AM       [1] No Known Allergies  "

## 2024-10-31 NOTE — Patient Instructions (Addendum)
 Medication Instructions:  Your physician recommends that you continue on your current medications as directed. Please refer to the Current Medication list given to you today.   *If you need a refill on your cardiac medications before your next appointment, please call your pharmacy*  Lab Work: NONE ordered at this time of appointment   Testing/Procedures: Chest Xray  ZIO XT- Long Term Monitor Instructions  Your physician has requested you wear a ZIO patch monitor for 14 days.  This is a single patch monitor. [employer] supplies one patch monitor per enrollment. Additional stickers are not available. Please do not apply patch if you will be having a Nuclear Stress Test,  Echocardiogram, Cardiac CT, MRI, or Chest Xray during the period you would be wearing the  monitor. The patch cannot be worn during these tests. You cannot remove and re-apply the  ZIO XT patch monitor.  Your ZIO patch monitor will be mailed 3 day USPS to your address on file. It may take 3-5 days  to receive your monitor after you have been enrolled.  Once you have received your monitor, please review the enclosed instructions. Your monitor  has already been registered assigning a specific monitor serial # to you.  Billing and Patient Assistance Program Information  We have supplied [employer] with any of your insurance information on file for billing purposes. [employer] offers a sliding scale Patient Assistance Program for patients that do not have  insurance, or whose insurance does not completely cover the cost of the ZIO monitor.  You must apply for the Patient Assistance Program to qualify for this discounted rate.  To apply, please call [employer] at [phone], select option 4, select option 2, ask to apply for  Patient Assistance Program. Meredeth will ask your household income, and how many people  are in your household. They will quote your out-of-pocket cost based on that information.  [employer] will also be able to  set up a 63-month, interest-free payment plan if needed.  Applying the monitor   Shave hair from upper left chest.  Hold abrader disc by orange tab. Rub abrader in 40 strokes over the upper left chest as  indicated in your monitor instructions.  Clean area with 4 enclosed alcohol pads. Let dry.  Apply patch as indicated in monitor instructions. Patch will be placed under collarbone on left  side of chest with arrow pointing upward.  Rub patch adhesive wings for 2 minutes. Remove white label marked 1. Remove the white  label marked 2. Rub patch adhesive wings for 2 additional minutes.  While looking in a mirror, press and release button in center of patch. A small green light will  flash 3-4 times. This will be your only indicator that the monitor has been turned on.  Do not shower for the first 24 hours. You may shower after the first 24 hours.  Press the button if you feel a symptom. You will hear a small click. Record Date, Time and  Symptom in the Patient Logbook.  When you are ready to remove the patch, follow instructions on the last 2 pages of Patient  Logbook. Stick patch monitor onto the last page of Patient Logbook.  Place Patient Logbook in the blue and white box. Use locking tab on box and tape box closed  securely. The blue and white box has prepaid postage on it. Please place it in the mailbox as  soon as possible. Your physician should have your test results approximately 7 days after the  monitor has been mailed back to [place].  Call [facility] Customer Care at [phone] if you have questions regarding  your ZIO XT patch monitor. Call them immediately if you see an orange light blinking on your  monitor.  If your monitor falls off in less than 4 days, contact our Monitor department at [phone].  If your monitor becomes loose or falls off after 4 days call [employer] at [phone] for  suggestions on securing your monitor   Follow-Up: At [facility], you and your health needs are our priority.  As part of our continuing mission to provide you with exceptional heart care, our providers are all part of one team.  This team includes your primary Cardiologist (physician) and Advanced Practice Providers or APPs (Physician Assistants and Nurse Practitioners) who all work together to provide you with the care you need, when you need it.  Your next appointment:   2-3 month(s)  Provider:   Soyla DELENA Merck, MD    We recommend signing up for the patient portal called MyChart.  Sign up information is provided on this After Visit Summary.  MyChart is used to connect with patients for Virtual Visits (Telemedicine).  Patients are able to view lab/test results, encounter notes, upcoming appointments, etc.  Non-urgent messages can be sent to your provider as well.   To learn more about what you can do with MyChart, go to [email or web address].   Other Instructions [address]

## 2024-11-01 ENCOUNTER — Encounter: Payer: Self-pay | Admitting: Nurse Practitioner

## 2024-11-03 ENCOUNTER — Ambulatory Visit: Payer: Self-pay | Admitting: Nurse Practitioner

## 2024-12-04 ENCOUNTER — Ambulatory Visit (HOSPITAL_BASED_OUTPATIENT_CLINIC_OR_DEPARTMENT_OTHER): Admitting: Pulmonary Disease

## 2024-12-22 ENCOUNTER — Ambulatory Visit: Admitting: Internal Medicine
